# Patient Record
Sex: Female | Born: 2000 | Hispanic: No | Marital: Single | Smoking: Never smoker
Health system: Southern US, Community
[De-identification: ages and names within clinical notes are randomized; demographics above are authoritative.]

## PROBLEM LIST (undated history)

## (undated) DIAGNOSIS — T7840XA Allergy, unspecified, initial encounter: Secondary | ICD-10-CM

## (undated) DIAGNOSIS — J45909 Unspecified asthma, uncomplicated: Secondary | ICD-10-CM

## (undated) HISTORY — DX: Unspecified asthma, uncomplicated: J45.909

---
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
## 2019-01-15 DIAGNOSIS — F4310 Post-traumatic stress disorder, unspecified: Secondary | ICD-10-CM | POA: Diagnosis not present

## 2019-01-21 DIAGNOSIS — F4310 Post-traumatic stress disorder, unspecified: Secondary | ICD-10-CM | POA: Diagnosis not present

## 2019-02-02 DIAGNOSIS — F4310 Post-traumatic stress disorder, unspecified: Secondary | ICD-10-CM | POA: Diagnosis not present

## 2019-03-03 DIAGNOSIS — F4310 Post-traumatic stress disorder, unspecified: Secondary | ICD-10-CM | POA: Diagnosis not present

## 2019-03-16 DIAGNOSIS — F4310 Post-traumatic stress disorder, unspecified: Secondary | ICD-10-CM | POA: Diagnosis not present

## 2019-04-16 DIAGNOSIS — F321 Major depressive disorder, single episode, moderate: Secondary | ICD-10-CM | POA: Diagnosis not present

## 2019-04-16 DIAGNOSIS — F4310 Post-traumatic stress disorder, unspecified: Secondary | ICD-10-CM | POA: Diagnosis not present

## 2019-05-18 IMAGING — US US OB LIMITED
1 series · 14 of 28 positions shown · non-contrast
Comparison: none

CLINICAL DATA: Pelvic cramping and abdominal pain x2 weeks.

EXAM:
LIMITED OBSTETRIC ULTRASOUND AND TRANSVAGINAL OBSTETRIC ULTRASOUND

[Series 1: us ob limited · 0.25mm/px · 14 of 37 slices shown]
[im 2/37]
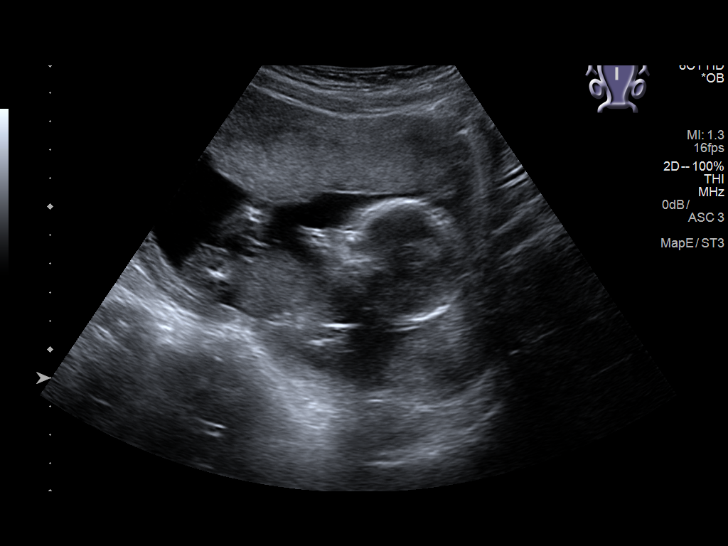
[im 5/37]
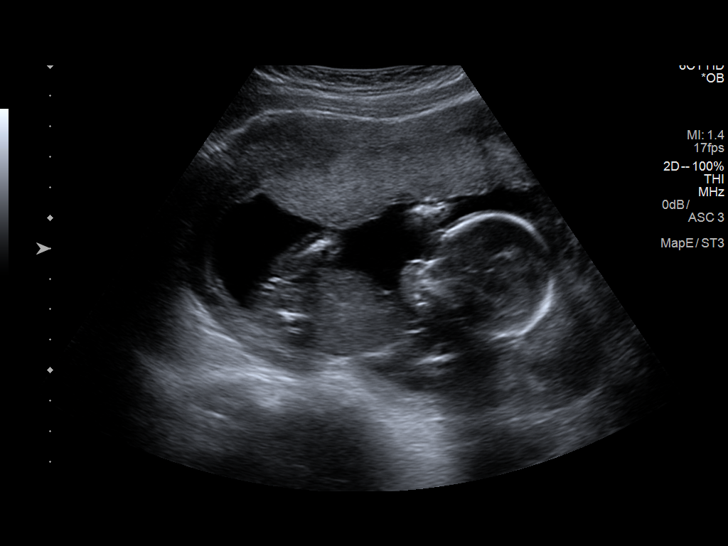
[im 7/37]
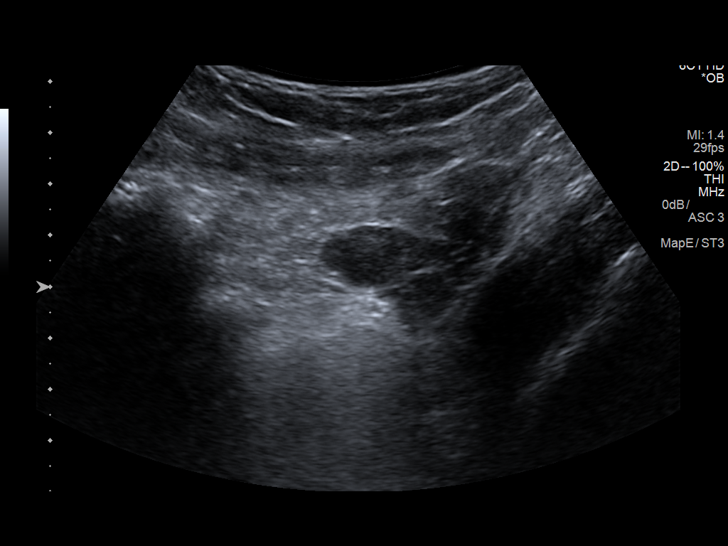
[im 10/37]
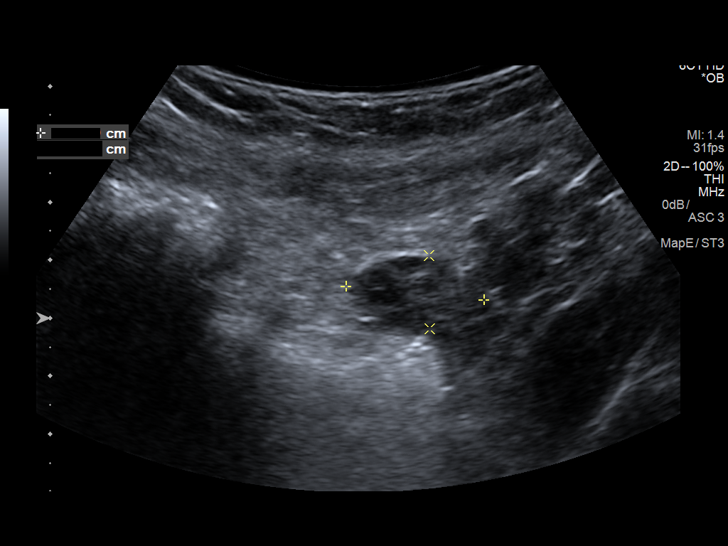
[im 13/37]
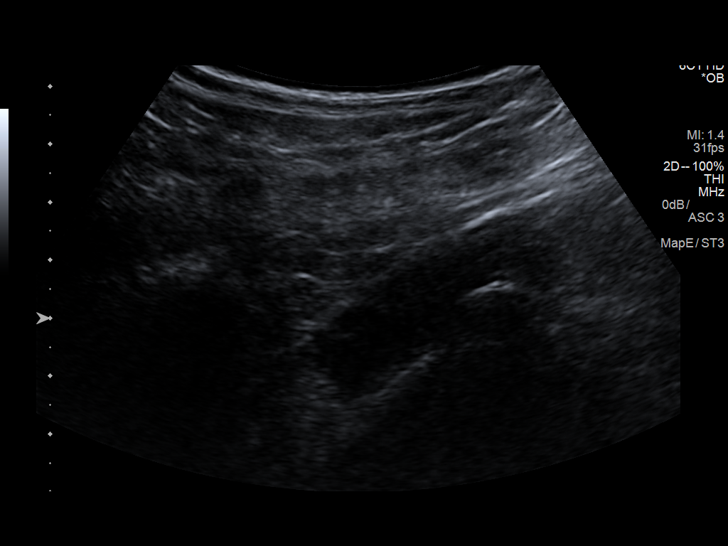
[im 15/37]
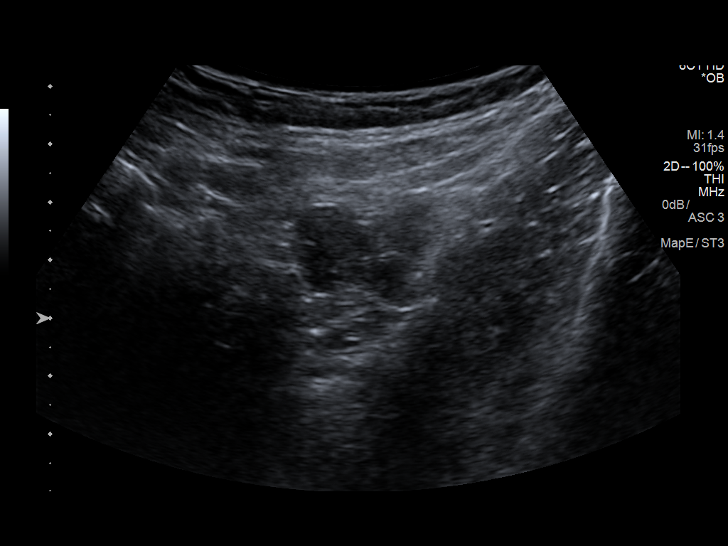
[im 18/37]
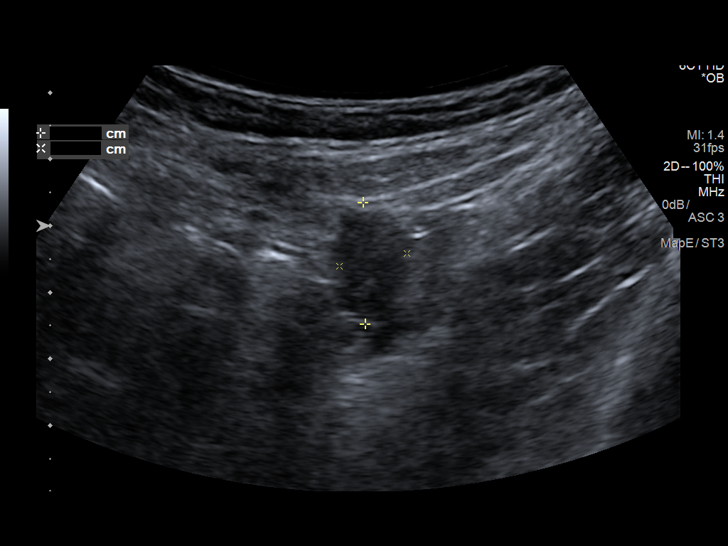
[im 21/37]
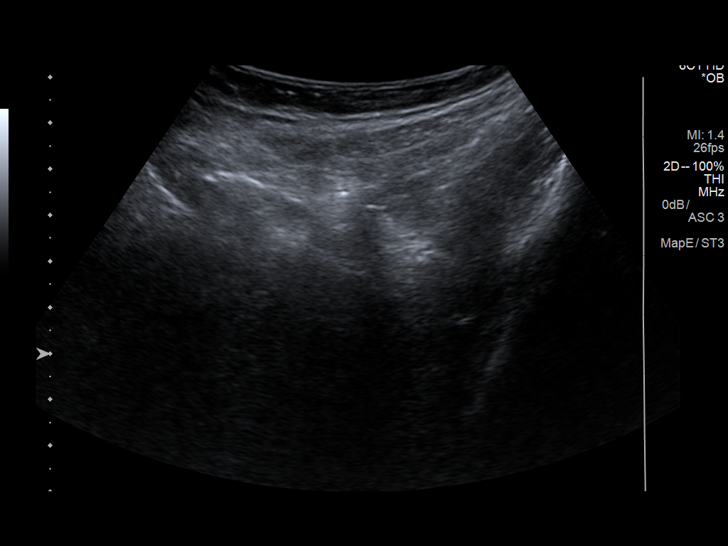
[im 23/37]
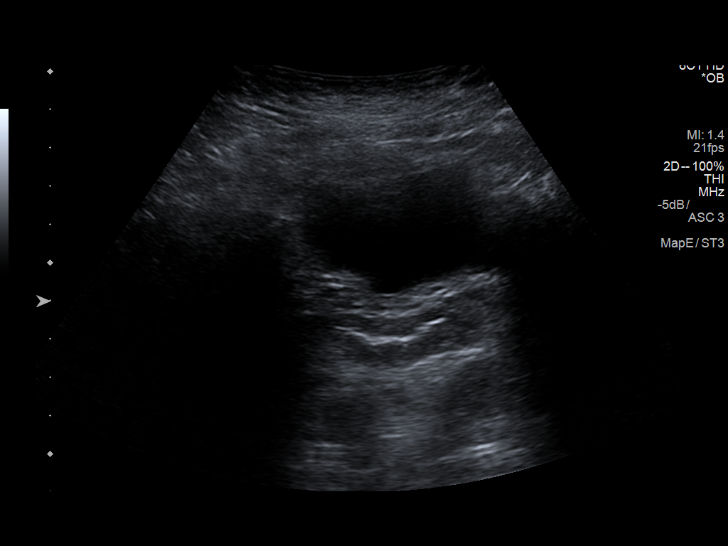
[im 26/37]
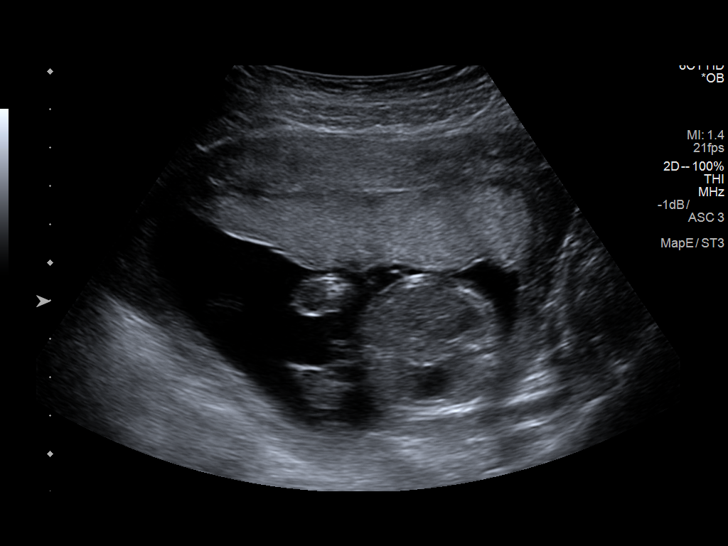
[im 29/37]
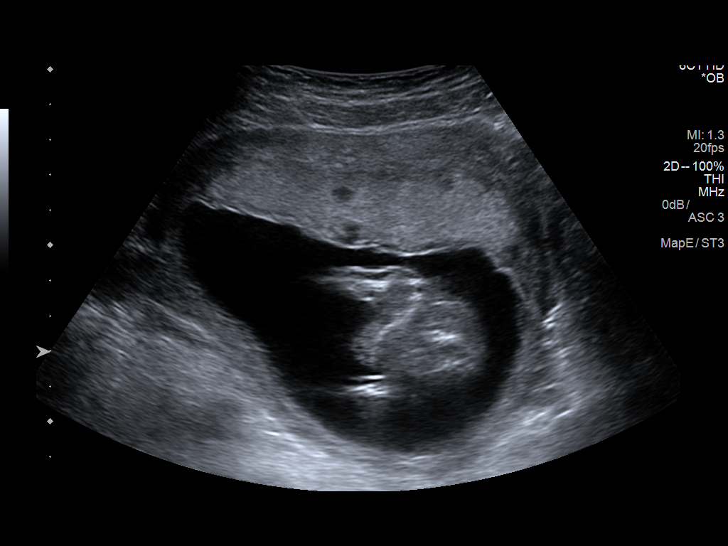
[im 31/37]
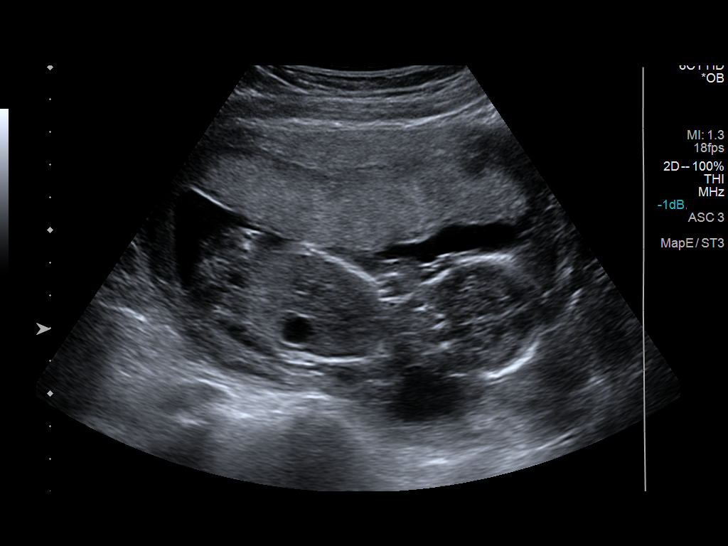
[im 34/37]
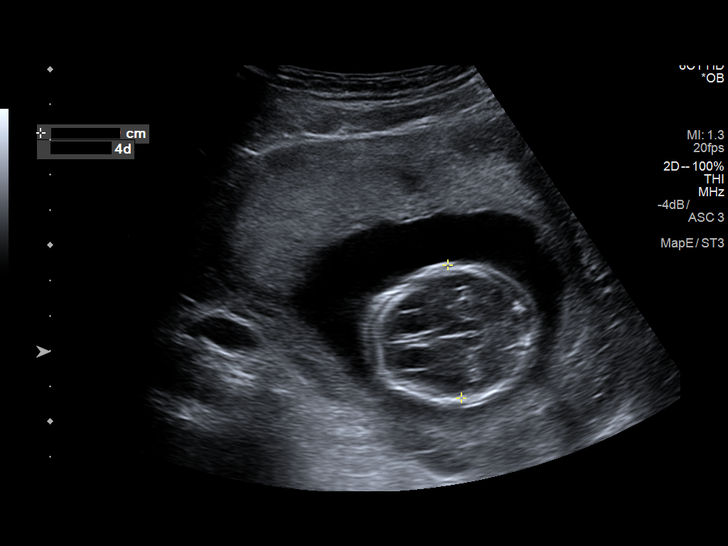
[im 37/37]
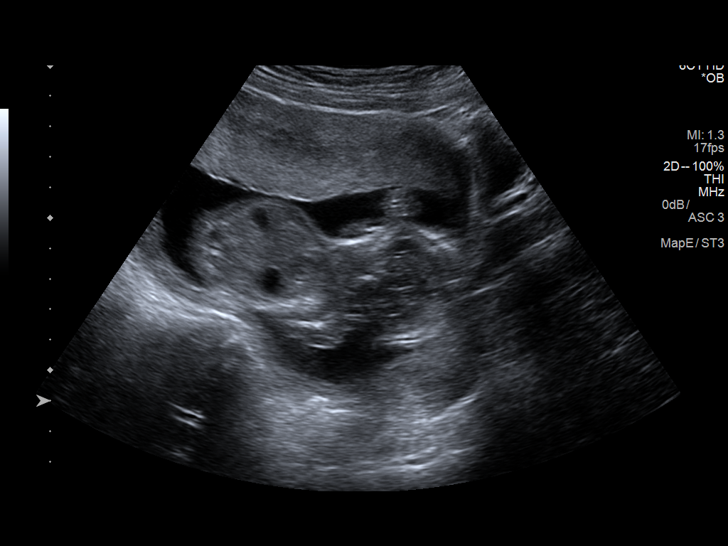

[14 of 28 positions shown; findings below may reference images not displayed]

FINDINGS: Number of Fetuses: 1

Heart Rate:  135 bpm

Movement: Yes

Presentation: Cephalic

Placental Location: Anterior

Previa: No

Amniotic Fluid (Subjective): Within normal limits.

BPD:  3.78cm 17w 4d

MATERNAL FINDINGS:

Cervix:  Appears closed.

Uterus/Adnexae:  Normal appearing ovaries.
IMPRESSION: Viable 17 week 4 day intrauterine gestation. Anterior placenta
without abruption. No previa.

This exam is performed on an emergent basis and does not
comprehensively evaluate fetal size, dating, or anatomy; follow-up
complete OB US should be considered if further fetal assessment is
warranted.

## 2019-06-11 DIAGNOSIS — F321 Major depressive disorder, single episode, moderate: Secondary | ICD-10-CM | POA: Diagnosis not present

## 2019-06-11 DIAGNOSIS — F4310 Post-traumatic stress disorder, unspecified: Secondary | ICD-10-CM | POA: Diagnosis not present

## 2019-09-26 ENCOUNTER — Ambulatory Visit: Payer: Medicaid Other | Attending: Internal Medicine

## 2019-09-26 DIAGNOSIS — Z23 Encounter for immunization: Secondary | ICD-10-CM

## 2019-09-26 NOTE — Progress Notes (Signed)
   Covid-19 Vaccination Clinic  Name:  Nerissa Constantin    MRN: [id number] DOB: Sep 14, 2000  09/26/2019  Ms. Cabrera-Saucedo was observed post Covid-19 immunization for 15 minutes without incident. She was provided with Vaccine Information Sheet and instruction to access the V-Safe system.   Ms. Croswell was instructed to call 911 with any severe reactions post vaccine: Marland Kitchen Difficulty breathing  . Swelling of face and throat  . A fast heartbeat  . A bad rash all over body  . Dizziness and weakness   Immunizations Administered    Name Date Dose VIS Date Route   Pfizer COVID-19 Vaccine 09/26/2019 10:12 AM 0.3 mL 06/24/2018 Intramuscular   Manufacturer: [employer], [employer]   Lot: [id number]   NDC: [address]-9

## 2019-10-19 ENCOUNTER — Ambulatory Visit: Payer: Medicaid Other | Attending: Internal Medicine

## 2019-10-19 DIAGNOSIS — Z23 Encounter for immunization: Secondary | ICD-10-CM

## 2019-10-19 NOTE — Progress Notes (Signed)
   Covid-19 Vaccination Clinic  Name:  Jill Woods    MRN: [id number] DOB: 2000/06/06  10/19/2019  Jill Woods was observed post Covid-19 immunization for 15 minutes without incident. She was provided with Vaccine Information Sheet and instruction to access the V-Safe system.   Jill Woods was instructed to call 911 with any severe reactions post vaccine: Marland Kitchen Difficulty breathing  . Swelling of face and throat  . A fast heartbeat  . A bad rash all over body  . Dizziness and weakness   Immunizations Administered    Name Date Dose VIS Date Route   Pfizer COVID-19 Vaccine 10/19/2019  3:45 PM 0.3 mL 06/24/2018 Intramuscular   Manufacturer: [employer], [employer]   Lot: [id number]   NDC: [address]-3      Covid-19 Vaccination Clinic  Name:  Jill Woods    MRN: [id number] DOB: 2000/09/21  10/19/2019  Jill Woods was observed post Covid-19 immunization for 15 minutes without incident. She was provided with Vaccine Information Sheet and instruction to access the V-Safe system.   Jill Woods was instructed to call 911 with any severe reactions post vaccine: Marland Kitchen Difficulty breathing  . Swelling of face and throat  . A fast heartbeat  . A bad rash all over body  . Dizziness and weakness   Immunizations Administered    Name Date Dose VIS Date Route   Pfizer COVID-19 Vaccine 10/19/2019  3:45 PM 0.3 mL 06/24/2018 Intramuscular   Manufacturer: [employer], [employer]   Lot: [id number]   NDC: [address]-7      Covid-19 Vaccination Clinic  Name:  Jill Woods    MRN: [id number] DOB: 2000-11-22  10/19/2019  Jill Woods was observed post Covid-19 immunization for 15 minutes without incident. She was provided with Vaccine Information Sheet and instruction to access the V-Safe system.   Jill Woods was instructed to call 911 with any severe reactions post vaccine: Marland Kitchen Difficulty breathing  . Swelling of face and throat  . A fast heartbeat    . A bad rash all over body  . Dizziness and weakness   Immunizations Administered    Name Date Dose VIS Date Route   Pfizer COVID-19 Vaccine 10/19/2019  3:45 PM 0.3 mL 06/24/2018 Intramuscular   Manufacturer: [employer], [employer]   Lot: [id number]   NDC: [address]-9
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
## 2019-11-26 ENCOUNTER — Emergency Department (HOSPITAL_COMMUNITY)
Admission: EM | Admit: 2019-11-26 | Discharge: 2019-11-26 | Disposition: A | Discharge status: Home / Self Care | Payer: BC Managed Care – PPO | Attending: Emergency Medicine | Admitting: Emergency Medicine

## 2019-11-26 ENCOUNTER — Other Ambulatory Visit: Payer: Self-pay

## 2019-11-26 DIAGNOSIS — R109 Unspecified abdominal pain: Secondary | ICD-10-CM | POA: Diagnosis present

## 2019-11-26 DIAGNOSIS — Z79899 Other long term (current) drug therapy: Secondary | ICD-10-CM | POA: Diagnosis not present

## 2019-11-26 DIAGNOSIS — N390 Urinary tract infection, site not specified: Secondary | ICD-10-CM | POA: Diagnosis not present

## 2019-11-26 DIAGNOSIS — R1084 Generalized abdominal pain: Secondary | ICD-10-CM | POA: Insufficient documentation

## 2019-11-26 LAB — COMPREHENSIVE METABOLIC PANEL
ALT: 12 U/L (ref 0–44)
AST: 16 U/L (ref 15–41)
Albumin: 4.2 g/dL (ref 3.5–5.0)
Alkaline Phosphatase: 82 U/L (ref 38–126)
Anion gap: 11 (ref 5–15)
BUN: 14 mg/dL (ref 6–20)
CO2: 24 mmol/L (ref 22–32)
Calcium: 9 mg/dL (ref 8.9–10.3)
Chloride: 102 mmol/L (ref 98–111)
Creatinine, Ser: 0.49 mg/dL (ref 0.44–1.00)
GFR calc Af Amer: >60 mL/min (ref >60)
GFR calc non Af Amer: >60 mL/min (ref >60)
Glucose, Bld: 91 mg/dL (ref 70–99)
Potassium: 3.5 mmol/L (ref 3.5–5.1)
Sodium: 137 mmol/L (ref 135–145)
Total Bilirubin: 0.7 mg/dL (ref 0.3–1.2)
Total Protein: 7.1 g/dL (ref 6.5–8.1)

## 2019-11-26 LAB — CBC
HCT: 37.8 % (ref 36.0–46.0)
Hemoglobin: 12.6 g/dL (ref 12.0–15.0)
MCH: 30.9 pg (ref 26.0–34.0)
MCHC: 33.3 g/dL (ref 30.0–36.0)
MCV: 92.6 fL (ref 80.0–100.0)
Platelets: 213 10*3/uL (ref 150–400)
RBC: 4.08 MIL/uL (ref 3.87–5.11)
RDW: 11.7 % (ref 11.5–15.5)
WBC: 11.4 10*3/uL — ABNORMAL HIGH (ref 4.0–10.5)
nRBC: 0 % (ref 0.0–0.2)

## 2019-11-26 LAB — URINALYSIS, ROUTINE W REFLEX MICROSCOPIC
Bilirubin Urine: NEGATIVE
Glucose, UA: NEGATIVE mg/dL
Hgb urine dipstick: NEGATIVE
Ketones, ur: 20 mg/dL — AB
Nitrite: NEGATIVE
Protein, ur: NEGATIVE mg/dL
Specific Gravity, Urine: 1.027 (ref 1.005–1.030)
pH: 5 (ref 5.0–8.0)

## 2019-11-26 LAB — I-STAT BETA HCG BLOOD, ED (MC, WL, AP ONLY): I-stat hCG, quantitative: <5 m[IU]/mL (ref <5)

## 2019-11-26 LAB — LIPASE, BLOOD: Lipase: 21 U/L (ref 11–51)

## 2019-11-26 MED ORDER — CEPHALEXIN 500 MG PO CAPS: 500.0000 mg | ORAL_CAPSULE | Freq: Three times a day (TID) | ORAL | 0 refills | Status: AC

## 2019-11-26 NOTE — ED Provider Notes (Signed)
[facility] EMERGENCY DEPARTMENT Provider Note   CSN: [id number] Arrival date & time: 11/26/19  1146     History Chief Complaint  Patient presents with  . Abdominal Pain    Jill Woods is a 19 y.o. female.  The history is provided by the patient. No language interpreter was used.  Abdominal Pain Pain location:  Generalized Pain quality: aching   Pain radiates to:  Does not radiate Pain severity:  Moderate Onset quality:  Gradual Timing:  Constant Progression:  Worsening Chronicity:  New Context: not sick contacts   Relieved by:  Nothing Worsened by:  Nothing Ineffective treatments:  None tried Associated symptoms: no chest pain, no cough and no diarrhea   Risk factors: not pregnant        No past medical history on file.  There are no problems to display for this patient.   Past Surgical History:  Procedure Laterality Date  . CESAREAN SECTION N/A 07/24/2018   Procedure: CESAREAN SECTION;  Surgeon: Lazaro Arms, MD;  Location: [facility] LD [facility];  Service: Obstetrics;  Laterality: N/A;     OB History    Gravida  1   Para      Term      Preterm      AB      Living        SAB      TAB      Ectopic      Multiple      Live Births              No family history on file.  Social History   Tobacco Use  . Smoking status: Never Smoker  . Smokeless tobacco: Never Used  Vaping Use  . Vaping Use: Never used  Substance Use Topics  . Alcohol use: Never  . Drug use: Never    Home Medications Prior to Admission medications   Medication Sig Start Date End Date Taking? Authorizing Provider  cephALEXin (KEFLEX) 500 MG capsule Take 1 capsule (500 mg total) by mouth 3 (three) times daily for 10 days. 11/26/19 12/06/19  Elson Areas, PA-C  ferrous sulfate 325 (65 FE) MG EC tablet Take 1 tablet (325 mg total) by mouth daily. 07/26/18 07/26/19  Gwenevere Abbot, MD  ibuprofen (ADVIL,MOTRIN) 800 MG tablet Take 1 tablet (800 mg  total) by mouth every 6 (six) hours. 07/26/18   Gwenevere Abbot, MD  Prenatal Vit-Fe Fumarate-FA (PRENATAL VITAMINS) 28-0.8 MG TABS Take 1 tablet by mouth daily. 09/29/18   Hermina Staggers, MD    Allergies    Pollen extract  Review of Systems   Review of Systems  Respiratory: Negative for cough.   Cardiovascular: Negative for chest pain.  Gastrointestinal: Positive for abdominal pain. Negative for diarrhea.  All other systems reviewed and are negative.   Physical Exam Updated Vital Signs BP (!) 113/59 (BP Location: Right Arm)   Pulse 81   Temp 98.6 F (37 C) (Oral)   Resp 16   SpO2 100%   Physical Exam Vitals and nursing note reviewed.  Constitutional:      Appearance: She is well-developed.  HENT:     Head: Normocephalic.  Cardiovascular:     Rate and Rhythm: Normal rate.  Pulmonary:     Effort: Pulmonary effort is normal.  Abdominal:     General: Bowel sounds are normal. There is no distension.     Palpations: Abdomen is soft.     Tenderness: There is  no abdominal tenderness.     Hernia: No hernia is present.  Musculoskeletal:        General: Normal range of motion.     Cervical back: Normal range of motion.  Skin:    General: Skin is warm.  Neurological:     General: No focal deficit present.     Mental Status: She is alert and oriented to person, place, and time.     ED Results / Procedures / Treatments   Labs (all labs ordered are listed, but only abnormal results are displayed) Labs Reviewed  CBC - Abnormal; Notable for the following components:      Result Value   WBC 11.4 (*)    All other components within normal limits  URINALYSIS, ROUTINE W REFLEX MICROSCOPIC - Abnormal; Notable for the following components:   APPearance HAZY (*)    Ketones, ur 20 (*)    Leukocytes,Ua MODERATE (*)    Bacteria, UA RARE (*)    All other components within normal limits  LIPASE, BLOOD  COMPREHENSIVE METABOLIC PANEL  I-STAT BETA HCG BLOOD, ED ([facility], [facility], [facility] ONLY)     EKG None  Radiology No results found.  Procedures Procedures (including critical care time)  Medications Ordered in ED Medications - No data to display  ED Course  I have reviewed the triage vital signs and the nursing notes.  Pertinent labs & imaging results that were available during my care of the patient were reviewed by me and considered in my medical decision making (see chart for details).    MDM Rules/Calculators/A&P                          MDM:  Pt has a nrmal abdominal exam.  Urine shows greater than 20 wbc's  Pt given rx for keflex and abdominal pain precautions  Final Clinical Impression(s) / ED Diagnoses Final diagnoses:  Urinary tract infection without hematuria, site unspecified    Rx / DC Orders ED Discharge Orders         Ordered    cephALEXin (KEFLEX) 500 MG capsule  3 times daily     Discontinue  Reprint     11/26/19 1706        An After Visit Summary was printed and given to the patient.    Elson Areas, Cordelia Poche 11/26/19 1710    Charlynne Pander, MD 11/26/19 [phone]

## 2019-11-26 NOTE — Discharge Instructions (Signed)
Return if any problems.

## 2019-11-26 NOTE — ED Notes (Signed)
Verbalized understanding of DC instructions, Rx, follow up care 

## 2019-11-26 NOTE — ED Triage Notes (Signed)
Pt reports generalized abdominal pain and nausea without vomiting since 0800 today. 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 Did not fall or pass out.

## 2019-11-27 ENCOUNTER — Telehealth: Payer: Self-pay | Admitting: *Deleted

## 2019-11-27 NOTE — Telephone Encounter (Signed)
Contacted pt to complete transition of care assessment:  Transition Care Management Follow-up Telephone Call  . Medicaid Managed Care Transition Call Status:MM [facility] Call Made  . Date of discharge and from where: [facility], November 26, 2019  . How have you been since you were released from the hospital? fine . Any questions or concerns? no  Items Reviewed: Marland Kitchen Did the pt receive and understand the discharge instructions provided? Yes  . Medications obtained and verified? Yes  . Any new allergies since your discharge? No  . Dietary orders reviewed? No . Do you have support at home? Yes, family Functional Questionnaire: (I = Independent and D = Dependent)  ADLs: Independent Bathing/Dressing:Independent Meal Prep: Independent Eating: Independent Maintaining continence: Independent Transferring/Ambulation: Independent Managing Meds: Independent   Follow up appointments reviewed:  PCP Hospital f/u appt confirmed? No  pt does not have a PCP Specialist Hospital f/u appt confirmed? N/a  Are transportation arrangements needed? No   If their condition worsens, is the pt aware to call PCP or go to the EmergencyDept.? yes Was the patient provided with contact information for the PCP's office or ED? yes  Was to pt encouraged to call back with questions or concerns? Yes  The pt states she would like establish care with a PCP; explained [facility] agent will call her regarding this matter; she verbalized understanding.  Burnard Bunting, RN, BSN, CCRN [facility] [phone]

## 2020-05-06 IMAGING — US US MFM OB COMP +14 WKS
1 series · 14 of 28 positions shown · non-contrast
Comparison: none

[Series 1: us mfm ob comp +14 wks · 94 acquisitions, 14 frames shown]
[im 4/94]
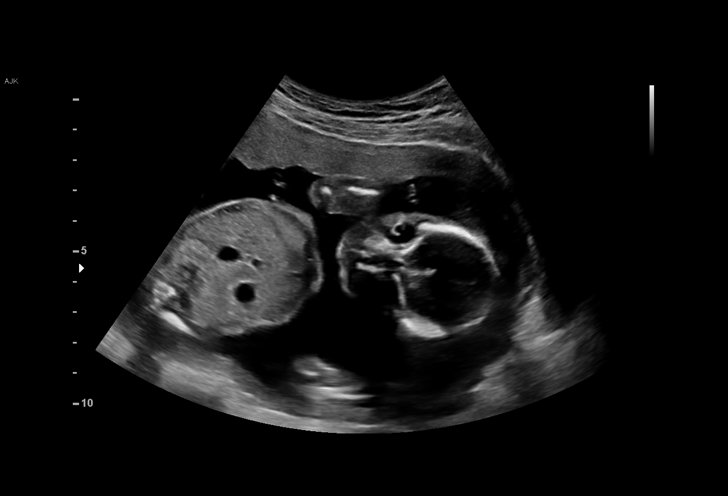
[im 11/94]
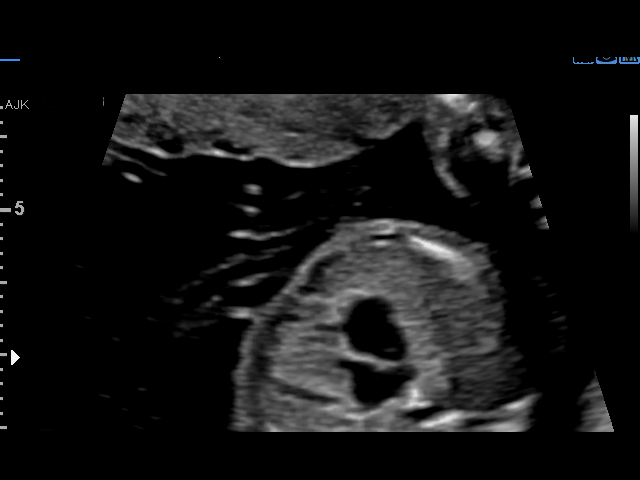
[im 18/94]
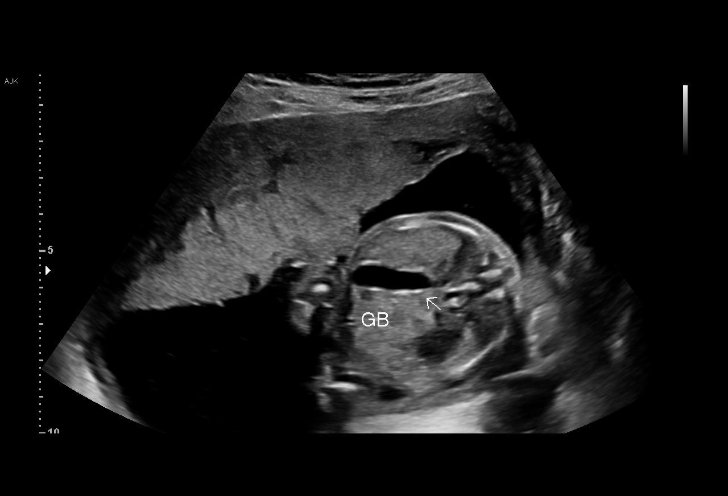
[im 25/94]
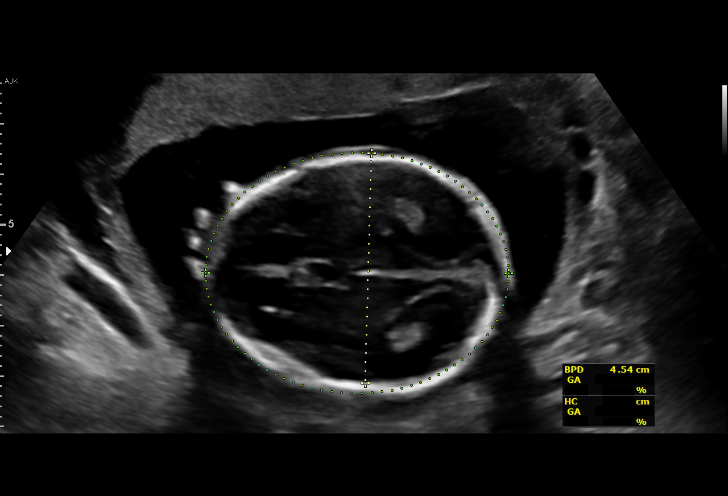
[im 32/94]
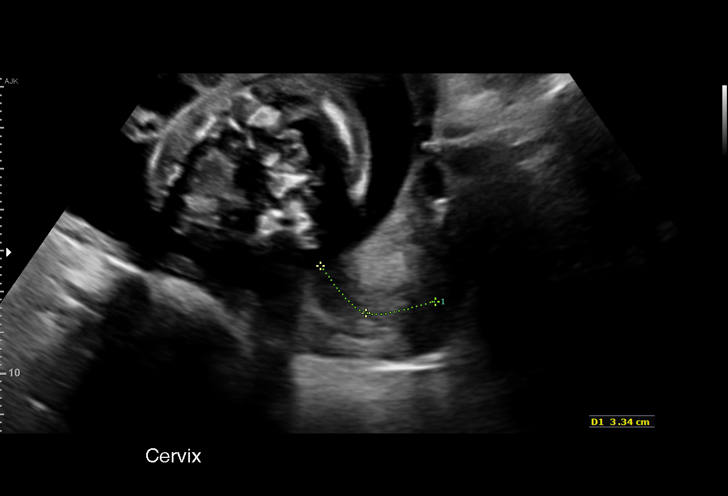
[im 38/94]
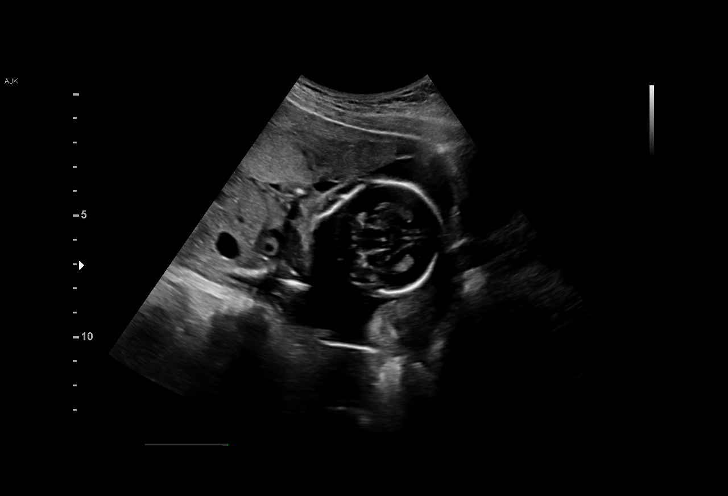
[im 45/94]
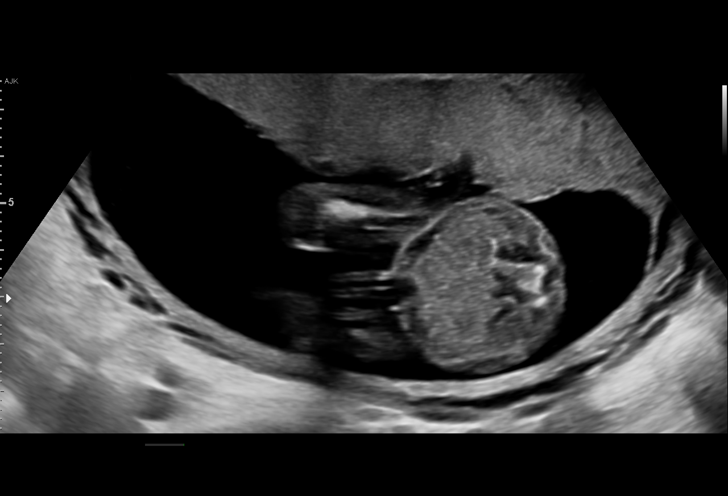
[im 52/94]
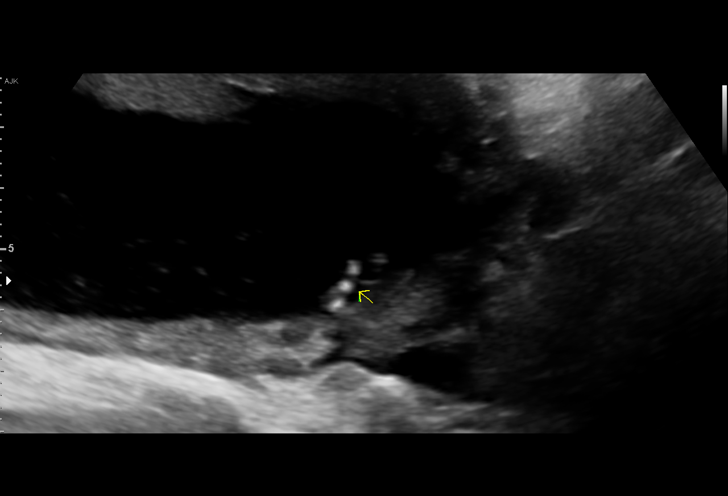
[im 59/94]
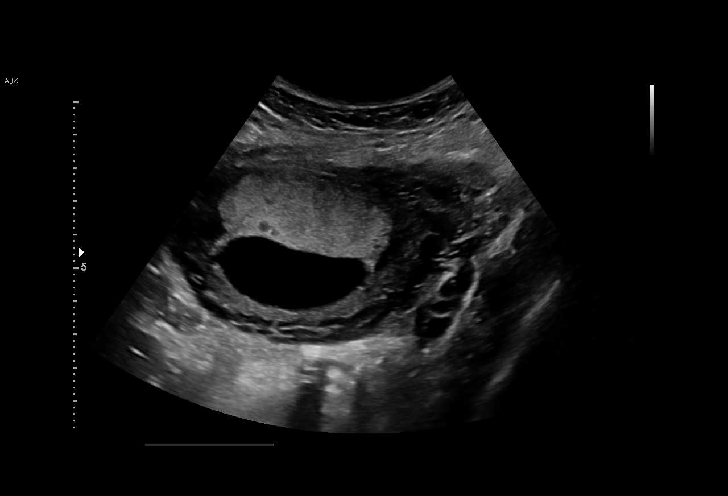
[im 66/94]
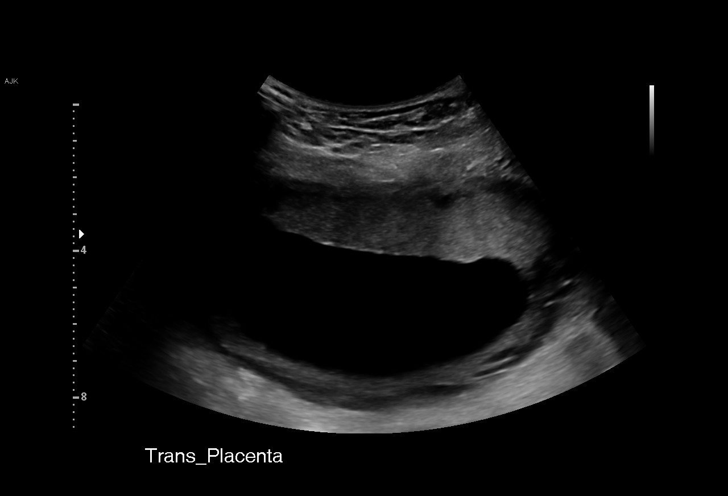
[im 73/94]
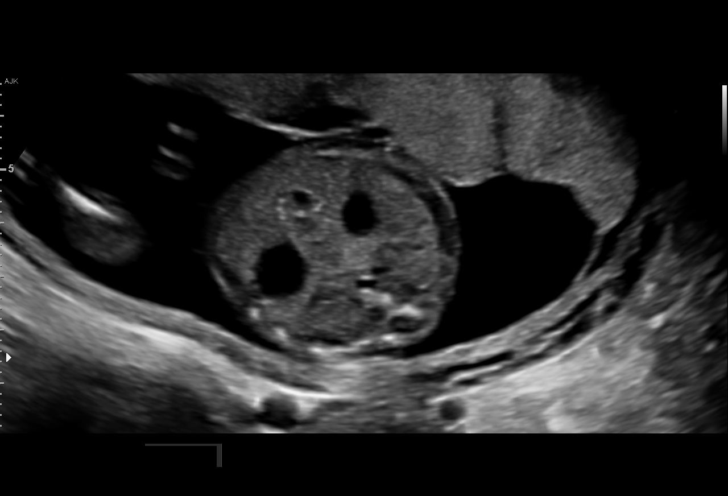
[im 80/94]
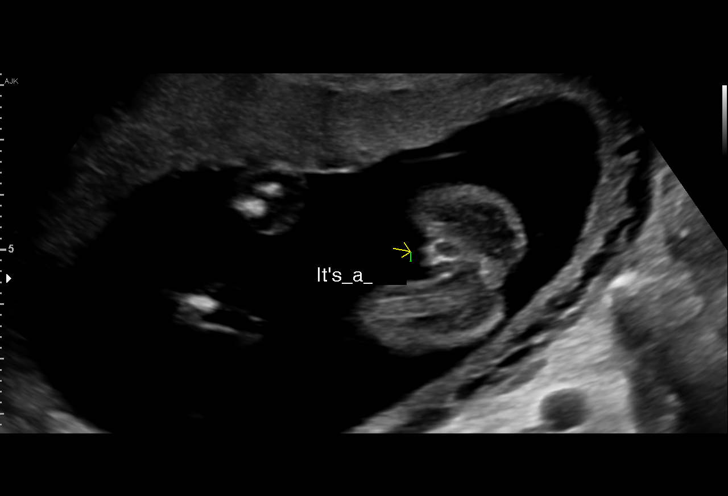
[im 87/94]
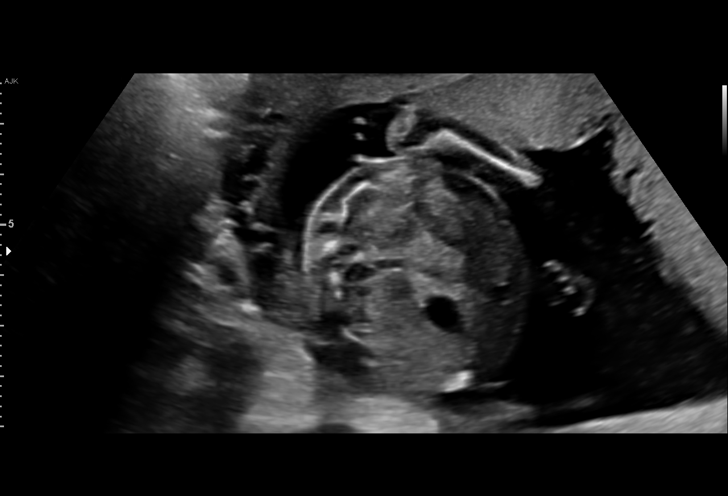
[im 94/94]
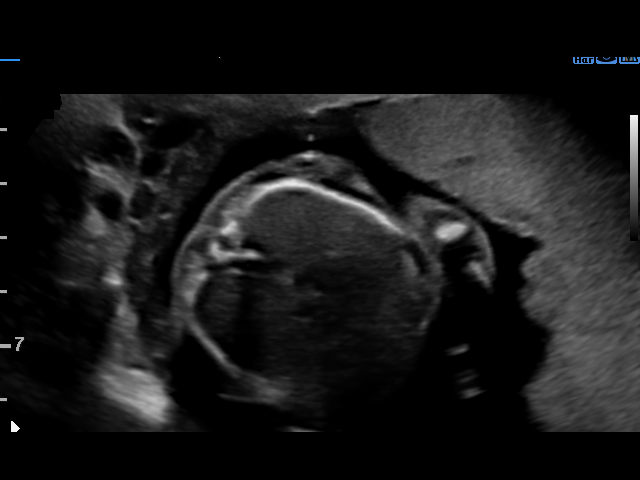

[14 of 28 positions shown; findings below may reference images not displayed]

[REDACTED]

  1  US MFM OB COMP + 14 WK               76805.01     MEI LAN
 ----------------------------------------------------------------------

 ----------------------------------------------------------------------
Indications

  Encounter for antenatal screening for
  malformations
  20 weeks gestation of pregnancy
 ----------------------------------------------------------------------
Vital Signs

 BMI:
Fetal Evaluation

 Num Of Fetuses:          1
 Fetal Heart Rate(bpm):   158
 Cardiac Activity:        Observed
 Presentation:            Cephalic
 Placenta:                Anterior
 P. Cord Insertion:       Visualized, central

 Amniotic Fluid
 AFI FV:      Within normal limits

                             Largest Pocket(cm)

Biometry

 BPD:      45.4  mm     G. Age:  19w 5d         32  %    CI:        73.09   %    70 - 86
                                                         FL/HC:       16.4  %    16.8 -
 HC:      168.8  mm     G. Age:  19w 4d         17  %    HC/AC:       1.09       1.09 -
 AC:      155.5  mm     G. Age:  20w 5d         63  %    FL/BPD:      61.0  %
 FL:       27.7  mm     G. Age:  18w 3d          4  %    FL/AC:       17.8  %    20 - 24
 HUM:      26.9  mm     G. Age:  18w 4d         10  %
 CER:      19.7  mm     G. Age:  18w 6d         18  %
 NFT:       5.6  mm

 LV:        5.4  mm
 CM:        5.4  mm

 Est. FW:     306   gm   0 lb 11 oz      39  %
OB History

 Gravidity:    1
Gestational Age

 LMP:           18w 4d        Date:  11/02/17                 EDD:   08/09/18
 U/S Today:     19w 4d                                        EDD:   08/02/18
 Best:          20w 1d     Det. By:  Previous Ultrasound      EDD:   07/29/18
                                     (02/22/18)
Anatomy

 Cranium:               Appears normal         Aortic Arch:            Appears normal
 Cavum:                 Appears normal         Ductal Arch:            Appears normal
 Ventricles:            Appears normal         Diaphragm:              Appears normal
 Choroid Plexus:        Appears normal         Stomach:                Appears normal, left
                                                                       sided
 Cerebellum:            Appears normal         Abdomen:                Appears normal
 Posterior Fossa:       Appears normal         Abdominal Wall:         Appears nml (cord
                                                                       insert, abd wall)
 Nuchal Fold:           Appears normal         Cord Vessels:           Appears normal (3
                                                                       vessel cord)
 Face:                  Appears normal         Kidneys:                Appear normal
                        (orbits and profile)
 Lips:                  Appears normal         Bladder:                Appears normal
 Thoracic:              Appears normal         Spine:                  Appears normal
 Heart:                 Appears normal         Upper Extremities:      Appears normal
                        (4CH, axis, and
                        situs)
 RVOT:                  Appears normal         Lower Extremities:      Appears normal
 LVOT:                  Appears normal

 Other:  Fetus appears to be female. Heels and LT 5th digit visualized.
         Technically difficult due to fetal position.
Cervix Uterus Adnexa

 Cervix
 Length:           3.34  cm.
 Normal appearance by transabdominal scan.

 Uterus
 No abnormality visualized.

 Left Ovary
 Not visualized.

 Right Ovary
 Not visualized.
 Adnexa
 No abnormality visualized.
Impression

 Normal interval growth.  No ultrasonic evidence of structural
 fetal anomalies.
 Genetic screening pending
Recommendations

 Follow up as clinically indicated.

## 2020-07-16 ENCOUNTER — Emergency Department (HOSPITAL_COMMUNITY)
Admission: EM | Admit: 2020-07-16 | Discharge: 2020-07-16 | Disposition: A | Discharge status: Home / Self Care | Payer: Medicaid Other | Attending: Emergency Medicine | Admitting: Emergency Medicine

## 2020-07-16 ENCOUNTER — Other Ambulatory Visit: Payer: Self-pay

## 2020-07-16 ENCOUNTER — Encounter (HOSPITAL_COMMUNITY): Payer: Self-pay | Admitting: Emergency Medicine

## 2020-07-16 DIAGNOSIS — R0981 Nasal congestion: Secondary | ICD-10-CM | POA: Diagnosis present

## 2020-07-16 DIAGNOSIS — Z7952 Long term (current) use of systemic steroids: Secondary | ICD-10-CM | POA: Insufficient documentation

## 2020-07-16 DIAGNOSIS — J301 Allergic rhinitis due to pollen: Secondary | ICD-10-CM | POA: Diagnosis not present

## 2020-07-16 DIAGNOSIS — J302 Other seasonal allergic rhinitis: Secondary | ICD-10-CM

## 2020-07-16 HISTORY — DX: Allergy, unspecified, initial encounter: T78.40XA

## 2020-07-16 MED ORDER — LORATADINE 10 MG PO TABS: 10.0000 mg | ORAL_TABLET | Freq: Every day | ORAL | 0 refills | Status: DC

## 2020-07-16 MED ORDER — FLUTICASONE PROPIONATE 50 MCG/ACT NA SUSP: 1.0000 | Freq: Every day | NASAL | 2 refills | Status: DC

## 2020-07-16 NOTE — ED Provider Notes (Signed)
[facility] EMERGENCY DEPARTMENT Provider Note   CSN: [id number] Arrival date & time: 07/16/20  1648     History Chief Complaint  Patient presents with  . Allergies    Jill Woods is a 20 y.o. female.  Patient is a 20 year old female with a history of seasonal allergies who presents with runny nose, watery eyes, and cough.  Patient states that her symptoms have been going on for about a month.  Patient denies any shortness of breath.  She denies any chest pain.  Patient states that she has had the symptoms whenever the weather gets warm for years now.  She denies any fevers or chills.  She denies any productive cough.  States that her cough is mostly dry.  She has not been fatigued or weak.  She has been able to continue to exert herself without any difficulties.  She denies any leg swelling or leg pain.  Denies any abdominal pain, nausea, vomiting, diarrhea.  Patient has been taking Benadryl for her symptoms without any relief.  Has not had any rashes or skin changes.        Past Medical History:  Diagnosis Date  . Allergies     There are no problems to display for this patient.   Past Surgical History:  Procedure Laterality Date  . CESAREAN SECTION N/A 07/24/2018   Procedure: CESAREAN SECTION;  Surgeon: Lazaro Arms, MD;  Location: [facility] LD [facility]S;  Service: Obstetrics;  Laterality: N/A;     OB History    Gravida  1   Para      Term      Preterm      AB      Living        SAB      IAB      Ectopic      Multiple      Live Births              No family history on file.  Social History   Tobacco Use  . Smoking status: Never Smoker  . Smokeless tobacco: Never Used  Vaping Use  . Vaping Use: Never used  Substance Use Topics  . Alcohol use: Never  . Drug use: Never    Home Medications Prior to Admission medications   Medication Sig Start Date End Date Taking? Authorizing Provider  fluticasone (FLONASE) 50 MCG/ACT  nasal spray Place 1 spray into both nostrils daily. 07/16/20  Yes Kugler, [place], MD  loratadine (CLARITIN) 10 MG tablet Take 1 tablet (10 mg total) by mouth daily. 07/16/20  Yes Kugler, [place], MD  ferrous sulfate 325 (65 FE) MG EC tablet Take 1 tablet (325 mg total) by mouth daily. 07/26/18 07/26/19  Gwenevere Abbot, MD  ibuprofen (ADVIL,MOTRIN) 800 MG tablet Take 1 tablet (800 mg total) by mouth every 6 (six) hours. 07/26/18   Gwenevere Abbot, MD  Prenatal Vit-Fe Fumarate-FA (PRENATAL VITAMINS) 28-0.8 MG TABS Take 1 tablet by mouth daily. 09/29/18   Hermina Staggers, MD    Allergies    Pollen extract  Review of Systems   Review of Systems  Constitutional: Negative for chills and fever.  HENT: Positive for rhinorrhea. Negative for ear pain and sore throat.   Eyes: Positive for itching. Negative for pain.  Respiratory: Positive for cough. Negative for shortness of breath.   Cardiovascular: Negative for chest pain and palpitations.  Gastrointestinal: Negative for abdominal pain and vomiting.  Genitourinary: Negative for dysuria and hematuria.  Musculoskeletal:  Negative for arthralgias and back pain.  Skin: Negative for color change and rash.  Neurological: Negative for seizures and syncope.  All other systems reviewed and are negative.   Physical Exam Updated Vital Signs BP (!) 105/55 (BP Location: Left Arm)   Pulse 69   Temp 98.8 F (37.1 C) (Oral)   Resp 15   LMP 07/09/2020   SpO2 98%   Physical Exam Vitals and nursing note reviewed.  Constitutional:      General: She is not in acute distress.    Appearance: She is well-developed.  HENT:     Head: Normocephalic and atraumatic.     Right Ear: External ear normal.     Left Ear: External ear normal.     Nose: Rhinorrhea present.     Mouth/Throat:     Mouth: Mucous membranes are moist.     Pharynx: Oropharynx is clear.  Eyes:     General:        Right eye: No discharge.        Left eye: No discharge.     Conjunctiva/sclera:  Conjunctivae normal.  Cardiovascular:     Rate and Rhythm: Normal rate and regular rhythm.  Pulmonary:     Effort: Pulmonary effort is normal. No respiratory distress.     Breath sounds: Normal breath sounds. No wheezing, rhonchi or rales.  Abdominal:     Palpations: Abdomen is soft.     Tenderness: There is no abdominal tenderness.  Musculoskeletal:     Cervical back: Neck supple.  Lymphadenopathy:     Cervical: No cervical adenopathy.  Skin:    General: Skin is warm and dry.     Findings: No rash.  Neurological:     General: No focal deficit present.     Mental Status: She is alert and oriented to person, place, and time.     ED Results / Procedures / Treatments   Labs (all labs ordered are listed, but only abnormal results are displayed) Labs Reviewed - No data to display  EKG None  Radiology No results found.  Procedures Procedures   Medications Ordered in ED Medications - No data to display  ED Course  I have reviewed the triage vital signs and the nursing notes.  Pertinent labs & imaging results that were available during my care of the patient were reviewed by me and considered in my medical decision making (see chart for details).    MDM Rules/Calculators/A&P                         20 year old female who presents with runny nose, eye watering, eye itching, and cough.  Symptoms have been present for about a month.  Patient has tried Benadryl without relief.  Physical exam without any notable rashes.  Her lungs are clear to auscultation without focal findings which would suggest pneumonia and no wheezing.  No lower extremity swelling or edema.  No fevers or chills to suggest infectious symptoms.  Presentation most consistent with allergic rhinitis. Will start patient on Claritin and Flonase.  We will have her follow-up with PCP.  Final Clinical Impression(s) / ED Diagnoses Final diagnoses:  Seasonal allergies  Seasonal allergic rhinitis due to pollen     Rx / DC Orders ED Discharge Orders         Ordered    fluticasone (FLONASE) 50 MCG/ACT nasal spray  Daily        07/16/20 2048    loratadine (  CLARITIN) 10 MG tablet  Daily        07/16/20 2048           Kugler, [place], MD 07/16/20 2253    Lorre Nick, MD 07/17/20 [phone]

## 2020-07-16 NOTE — ED Triage Notes (Signed)
C/o sneezing, runny nose, and itchy eyes for several months.  States she is taking Benadryl without relief.

## 2020-07-16 NOTE — ED Provider Notes (Signed)
I saw and evaluated the patient, reviewed the resident's note and I agree with the findings and plan.    20 year old female presents with allergy symptoms.  Will place on medications and discharge   Lorre Nick, MD 07/16/20 2101

## 2020-07-18 ENCOUNTER — Telehealth: Payer: Self-pay

## 2020-07-18 NOTE — Telephone Encounter (Signed)
Transition Care Management Unsuccessful Follow-up Telephone Call  Date of discharge and from where:  07/16/2020 from [facility]  Attempts:  1st Attempt  Reason for unsuccessful TCM follow-up call:  Left voice message

## 2020-07-19 NOTE — Telephone Encounter (Signed)
Transition Care Management Follow-up Telephone Call  Date of discharge and from where: 07/16/2020 - [facility]  How have you been since you were released from the hospital? "Feeling a little better"  Any questions or concerns? No  Items Reviewed:  Did the pt receive and understand the discharge instructions provided? Yes   Medications obtained and verified? Yes   Other? No   Any new allergies since your discharge? No   Dietary orders reviewed? No  Do you have support at home? Yes   Home Care and Equipment/Supplies: Were home health services ordered? not applicable If so, what is the name of the agency? N/A  Has the agency set up a time to come to the patient's home? not applicable Were any new equipment or medical supplies ordered?  No What is the name of the medical supply agency? N/A Were you able to get the supplies/equipment? not applicable Do you have any questions related to the use of the equipment or supplies? No  Functional Questionnaire: (I = Independent and D = Dependent) ADLs: I  Bathing/Dressing- I  Meal Prep- I  Eating- I  Maintaining continence- I  Transferring/Ambulation- I  Managing Meds- I  Follow up appointments reviewed:   PCP Hospital f/u appt confirmed? No    Specialist Hospital f/u appt confirmed? No    Are transportation arrangements needed? No   If their condition worsens, is the pt aware to call PCP or go to the Emergency Dept.? Yes  Was the patient provided with contact information for the PCP's office or ED? Yes  Was to pt encouraged to call back with questions or concerns? Yes

## 2020-08-30 ENCOUNTER — Encounter (HOSPITAL_COMMUNITY): Payer: Self-pay

## 2020-08-30 ENCOUNTER — Other Ambulatory Visit: Payer: Self-pay

## 2020-08-30 ENCOUNTER — Emergency Department (HOSPITAL_COMMUNITY)
Admission: EM | Admit: 2020-08-30 | Discharge: 2020-08-30 | Disposition: A | Discharge status: Home / Self Care | Payer: Medicaid Other | Attending: Emergency Medicine | Admitting: Emergency Medicine

## 2020-08-30 DIAGNOSIS — J301 Allergic rhinitis due to pollen: Secondary | ICD-10-CM | POA: Diagnosis not present

## 2020-08-30 DIAGNOSIS — J302 Other seasonal allergic rhinitis: Secondary | ICD-10-CM | POA: Diagnosis not present

## 2020-08-30 DIAGNOSIS — J3489 Other specified disorders of nose and nasal sinuses: Secondary | ICD-10-CM | POA: Diagnosis present

## 2020-08-30 MED ORDER — LORATADINE 10 MG PO TABS: 10.0000 mg | ORAL_TABLET | Freq: Every day | ORAL | 2 refills | Status: DC

## 2020-08-30 NOTE — ED Triage Notes (Signed)
Pt reports she has allergies. Pt reports she was seen here for same and sent w/ RX w/ no relief.

## 2020-08-30 NOTE — ED Provider Notes (Signed)
[facility] EMERGENCY DEPARTMENT Provider Note   CSN: [id number] Arrival date & time: 08/30/20  4166     History No chief complaint on file.   Jill Woods is a 20 y.o. female.  She is here with a complaint of allergies.  She says when she goes outside her nose starts running and she sneezes.  She gets this every spring.  She was seen a month ago and given Flonase and loratadine with some improvement but she is out of the loratadine.  She has been using Benadryl but it makes her tired.  She is also tried Careers adviser without any improvement.  The history is provided by the patient.  URI Presenting symptoms: rhinorrhea   Presenting symptoms: no fever   Severity:  Moderate Onset quality:  Gradual Timing:  Intermittent Progression:  Unchanged Chronicity:  Recurrent Relieved by:  Nothing Exacerbated by: pollen. Ineffective treatments:  Prescription medications Associated symptoms: sneezing        Past Medical History:  Diagnosis Date  . Allergies     There are no problems to display for this patient.   Past Surgical History:  Procedure Laterality Date  . CESAREAN SECTION N/A 07/24/2018   Procedure: CESAREAN SECTION;  Surgeon: Lazaro Arms, MD;  Location: [facility] LD [facility];  Service: Obstetrics;  Laterality: N/A;     OB History    Gravida  1   Para      Term      Preterm      AB      Living        SAB      IAB      Ectopic      Multiple      Live Births              History reviewed. No pertinent family history.  Social History   Tobacco Use  . Smoking status: Never Smoker  . Smokeless tobacco: Never Used  Vaping Use  . Vaping Use: Never used  Substance Use Topics  . Alcohol use: Never  . Drug use: Never    Home Medications Prior to Admission medications   Medication Sig Start Date End Date Taking? Authorizing Provider  ferrous sulfate 325 (65 FE) MG EC tablet Take 1 tablet (325 mg total) by mouth daily. 07/26/18  07/26/19  Gwenevere Abbot, MD  fluticasone (FLONASE) 50 MCG/ACT nasal spray Place 1 spray into both nostrils daily. 07/16/20   Kugler, [place], MD  ibuprofen (ADVIL,MOTRIN) 800 MG tablet Take 1 tablet (800 mg total) by mouth every 6 (six) hours. 07/26/18   Gwenevere Abbot, MD  loratadine (CLARITIN) 10 MG tablet Take 1 tablet (10 mg total) by mouth daily. 07/16/20   Kugler, [place], MD  Prenatal Vit-Fe Fumarate-FA (PRENATAL VITAMINS) 28-0.8 MG TABS Take 1 tablet by mouth daily. 09/29/18   Hermina Staggers, MD    Allergies    Pollen extract  Review of Systems   Review of Systems  Constitutional: Negative for fever.  HENT: Positive for rhinorrhea and sneezing.     Physical Exam Updated Vital Signs BP 100/62 (BP Location: Left Arm)   Pulse 80   Temp 99 F (37.2 C) (Oral)   Resp 18   LMP 08/16/2020   SpO2 99%   Physical Exam Vitals and nursing note reviewed.  Constitutional:      Appearance: Normal appearance. She is well-developed.  HENT:     Head: Normocephalic and atraumatic.     Right Ear: Tympanic  membrane normal.     Left Ear: Tympanic membrane normal.     Nose: Nose normal.     Mouth/Throat:     Mouth: Mucous membranes are moist.     Pharynx: Oropharynx is clear.  Eyes:     Conjunctiva/sclera: Conjunctivae normal.  Cardiovascular:     Rate and Rhythm: Normal rate and regular rhythm.  Pulmonary:     Effort: Pulmonary effort is normal.     Breath sounds: Normal breath sounds.  Musculoskeletal:     Cervical back: Neck supple.  Skin:    General: Skin is warm and dry.  Neurological:     General: No focal deficit present.     Mental Status: She is alert.     GCS: GCS eye subscore is 4. GCS verbal subscore is 5. GCS motor subscore is 6.     ED Results / Procedures / Treatments   Labs (all labs ordered are listed, but only abnormal results are displayed) Labs Reviewed - No data to display  EKG None  Radiology No results found.  Procedures Procedures    Medications Ordered in ED Medications - No data to display  ED Course  I have reviewed the triage vital signs and the nursing notes.  Pertinent labs & imaging results that were available during my care of the patient were reviewed by me and considered in my medical decision making (see chart for details).    MDM Rules/Calculators/A&P                          Well-appearing female here with nasal congestion sneezing in the setting of seasonal allergies.  Pulse ox 99% on room air.  Do not feel she needs any lab work or imaging at this time.  She said that the loratadine did not my very much nor did the Flonase but she wants a refill of her loratadine.  She did not have any improvement with Allegra in the past.  I have provided her a prescription of the loratadine. Final Clinical Impression(s) / ED Diagnoses Final diagnoses:  Seasonal allergic rhinitis due to pollen    Rx / DC Orders ED Discharge Orders         Ordered    loratadine (CLARITIN) 10 MG tablet  Daily        08/30/20 0858           Terrilee Files, MD 08/30/20 1725

## 2020-08-31 ENCOUNTER — Telehealth: Payer: Self-pay | Admitting: *Deleted

## 2020-08-31 NOTE — Telephone Encounter (Signed)
Transition Care Management Follow-up Telephone Call  Date of discharge and from where: 08/30/2020 [facility]  How have you been since you were released from the hospital? "No change"  Any questions or concerns? No  Items Reviewed:  Did the pt receive and understand the discharge instructions provided? Yes   Medications obtained and verified? Yes   Other? No   Any new allergies since your discharge? No   Dietary orders reviewed? No  Do you have support at home? No    Functional Questionnaire: (I = Independent and D = Dependent) ADLs: I  Bathing/Dressing- I  Meal Prep- I  Eating- I  Maintaining continence- I  Transferring/Ambulation- I  Managing Meds- I  Follow up appointments reviewed:   PCP Hospital f/u appt confirmed? No    Specialist Hospital f/u appt confirmed? No    Are transportation arrangements needed? N/A  If their condition worsens, is the pt aware to call PCP or go to the Emergency Dept.? Yes  Was the patient provided with contact information for the PCP's office or ED? Yes  Was to pt encouraged to call back with questions or concerns? Yes

## 2020-09-16 IMAGING — US US MFM FETAL BPP WO NON STRESS
1 series · 15 of 28 positions shown · non-contrast
Comparison: none

[Series 1: us mfm fetal bpp wo non stress · 29 acquisitions, 15 frames shown]
[im 1/29]
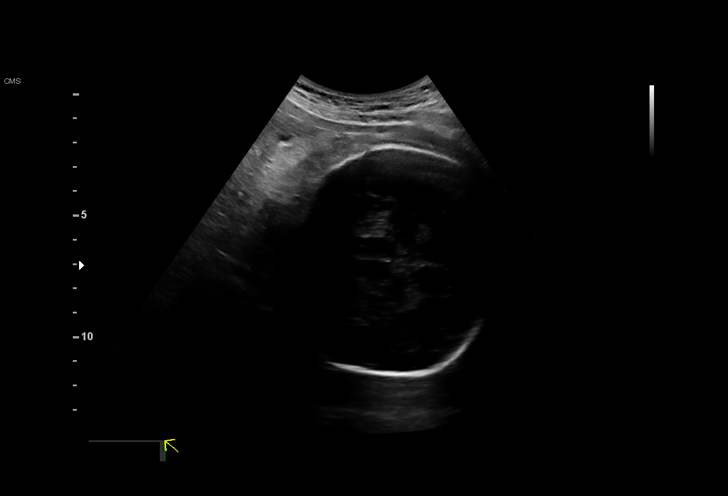
[im 3/29]
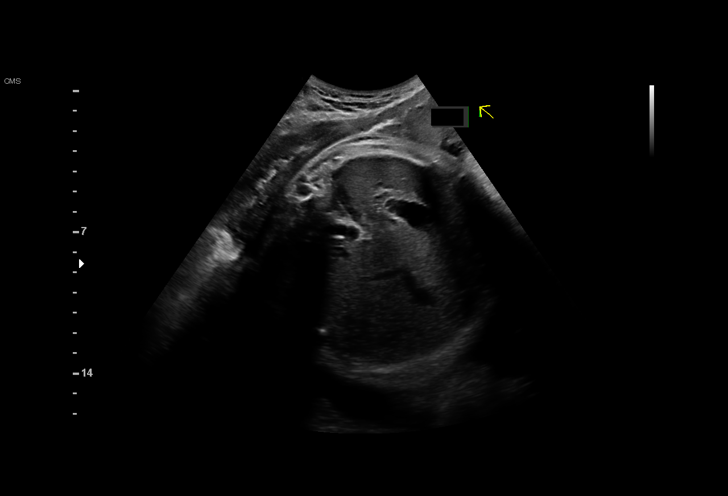
[im 5/29]
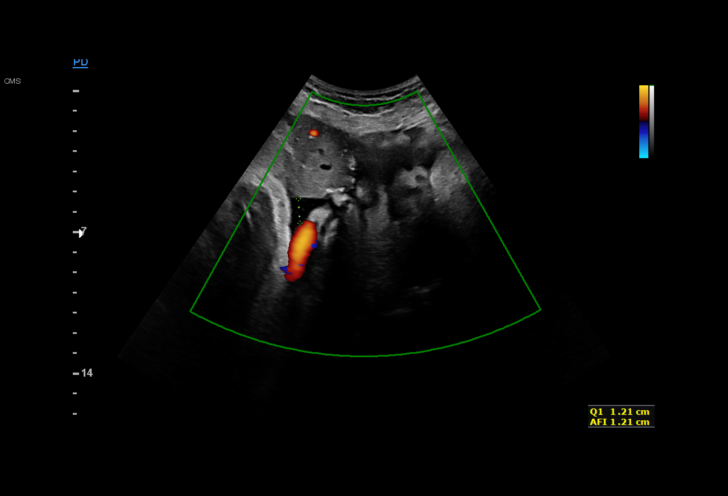
[im 7/29]
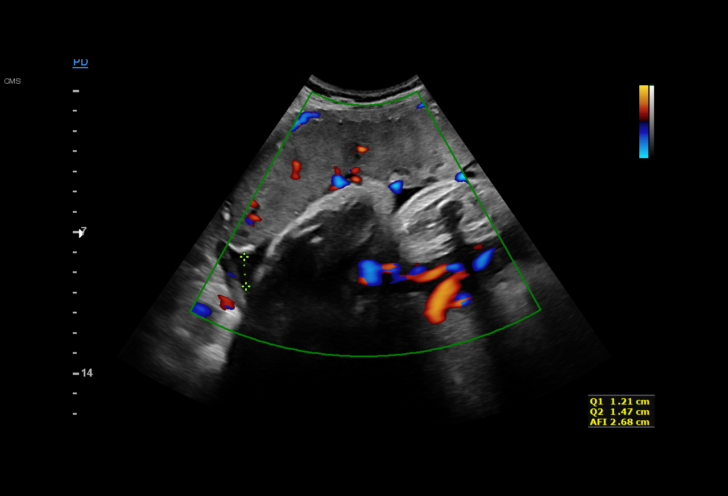
[im 9/29]
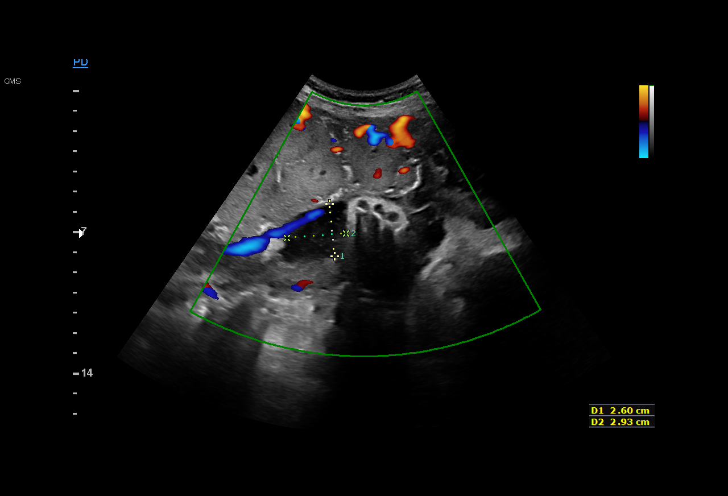
[im 11/29]
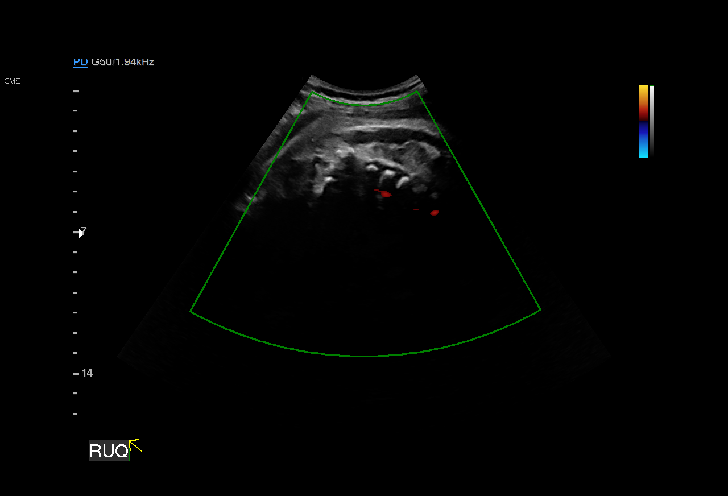
[im 13/29]
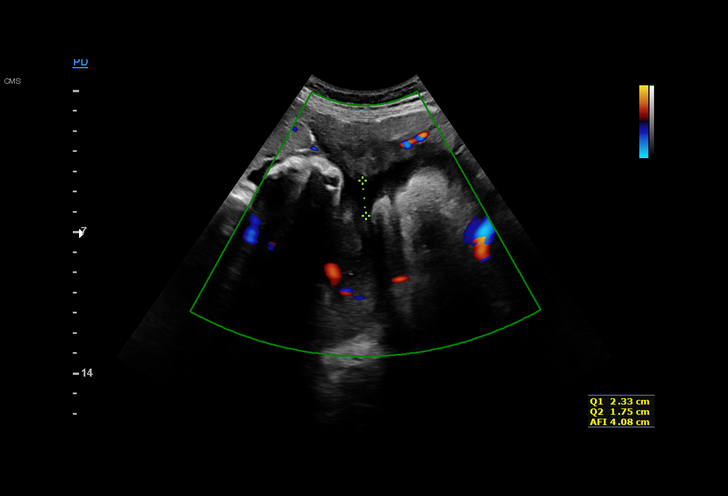
[im 15/29]
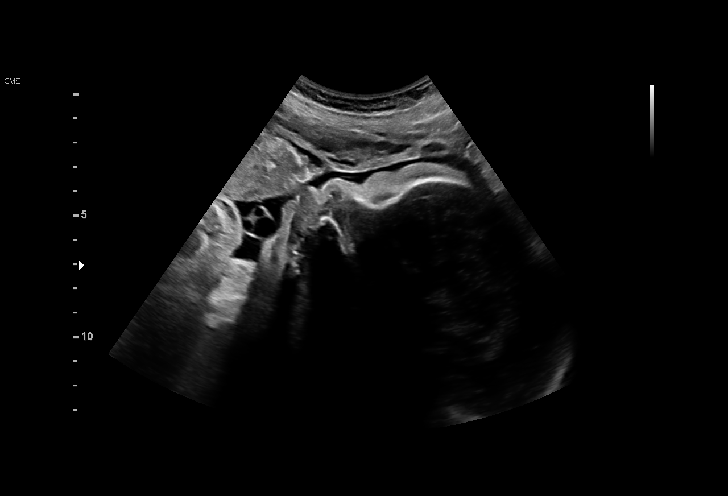
[im 16/29]
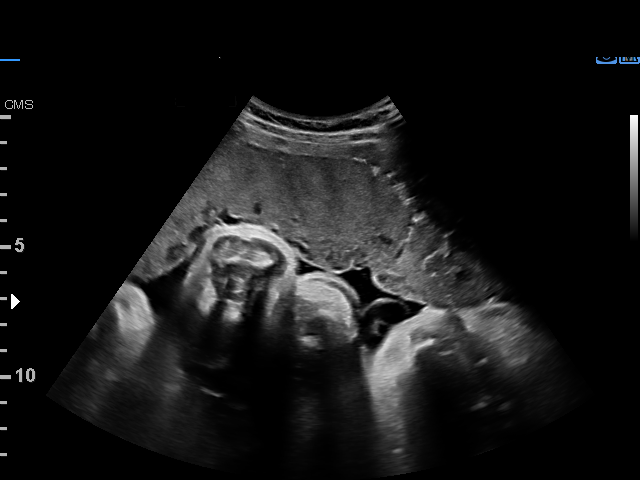
[im 18/29]
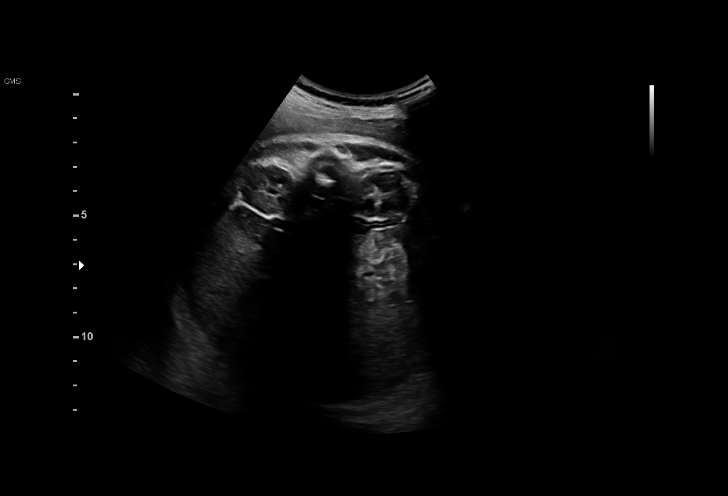
[im 20/29]
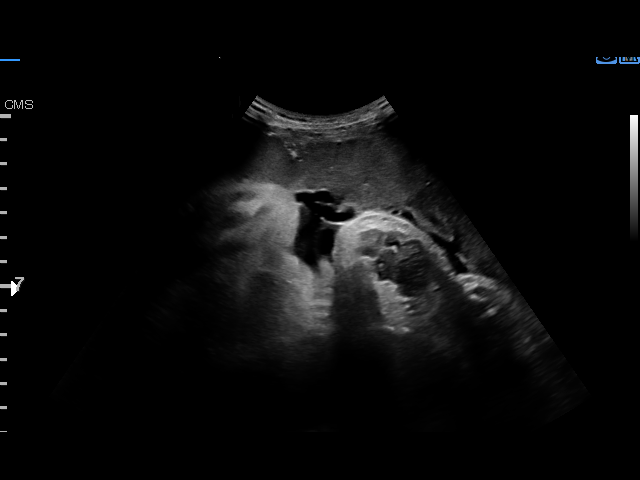
[im 22/29]
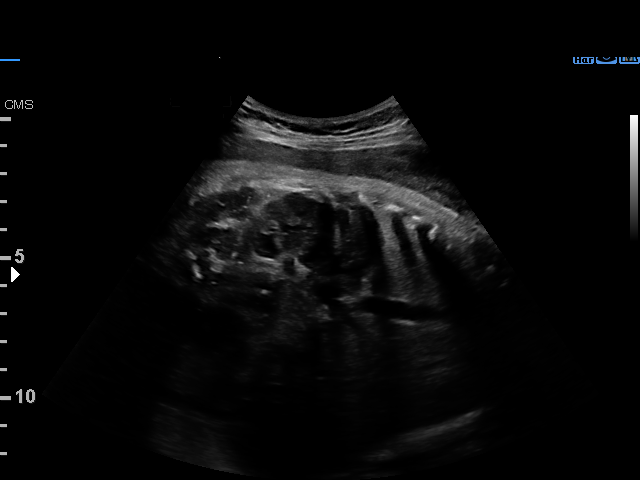
[im 24/29]
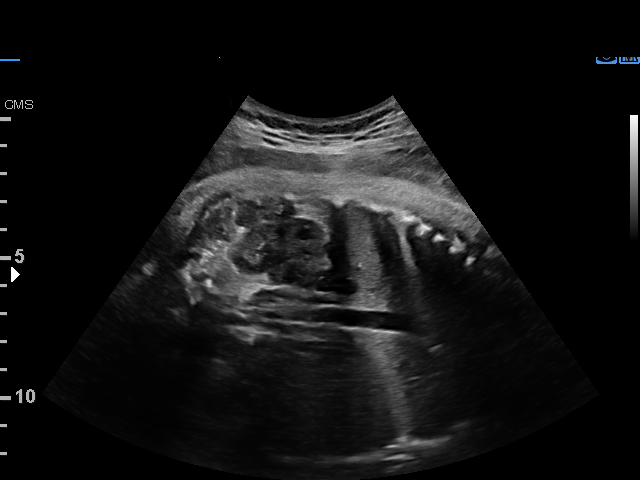
[im 26/29]
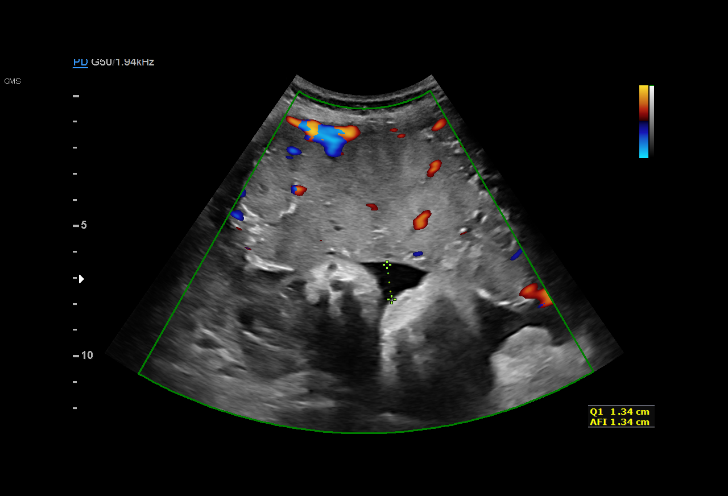
[im 29/29]
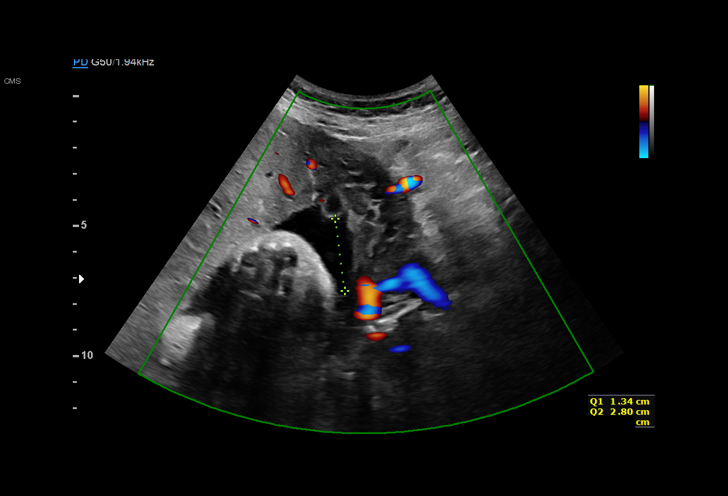

[15 of 28 positions shown; findings below may reference images not displayed]

FONTI

                    CNM

 ----------------------------------------------------------------------

 ----------------------------------------------------------------------
Indications

  39 weeks gestation of pregnancy
  Decreased fetal movements, third trimester,
  unspecified
 ----------------------------------------------------------------------
Vital Signs

                                                 Height:        4'11"
Fetal Evaluation

 Num Of Fetuses:          1
 Fetal Heart Rate(bpm):   161
 Cardiac Activity:        Observed
 Presentation:            Cephalic

 Amniotic Fluid
 AFI FV:      Oligohydramnios

 AFI Sum(cm)     %Tile       Largest Pocket(cm)
 4.14            < 3

 RUQ(cm)       RLQ(cm)        LUQ(cm)        LLQ(cm)
 0             0
Biophysical Evaluation
 Amniotic F.V:   Pocket => 2 cm two          F. Tone:         Observed
                 planes
 F. Movement:    Observed                    Score:           [DATE]
 F. Breathing:   Observed
OB History

 Gravidity:     1
Gestational Age

 LMP:            37w 4d       Date:  11/02/17                   EDD:  08/09/18
 Best:           39w 1d    Det. By:  Previous Ultrasound        EDD:  07/29/18
                                     (02/22/18)
Impression

 Biophysical profile [DATE]
Recommendations

 Consider delivery given term pregnancy and oligohydramnios

## 2020-09-30 ENCOUNTER — Emergency Department (HOSPITAL_COMMUNITY)
Admission: EM | Admit: 2020-09-30 | Discharge: 2020-09-30 | Disposition: A | Discharge status: Home / Self Care | Payer: Medicaid Other | Attending: Emergency Medicine | Admitting: Emergency Medicine

## 2020-09-30 ENCOUNTER — Encounter (HOSPITAL_COMMUNITY): Payer: Self-pay | Admitting: Pharmacy Technician

## 2020-09-30 ENCOUNTER — Other Ambulatory Visit: Payer: Self-pay

## 2020-09-30 DIAGNOSIS — R519 Headache, unspecified: Secondary | ICD-10-CM | POA: Diagnosis not present

## 2020-09-30 NOTE — ED Triage Notes (Signed)
Pt here with reports of L sided headache onset today. Denies hx migraines. Took OTC meds without relief.

## 2020-09-30 NOTE — ED Provider Notes (Signed)
[facility] EMERGENCY DEPARTMENT Provider Note   CSN: [id number] Arrival date & time: 09/30/20  1027     History Chief Complaint  Patient presents with  . Headache    Jill Woods is a 20 y.o. female.  20 year old female presents with cute onset of left-sided headache which is since resolved.  This occurred when she was at work and stated she has some flashes of lights is gone now.  Denies any fever or chills.  No neck pain.  Did not taking medication prior to arrival.  Feels back to her baseline.  Denies any associate neurological features.  Denies any vision loss        Past Medical History:  Diagnosis Date  . Allergies     There are no problems to display for this patient.   Past Surgical History:  Procedure Laterality Date  . CESAREAN SECTION N/A 07/24/2018   Procedure: CESAREAN SECTION;  Surgeon: Lazaro Arms, MD;  Location: [facility] LD [facility]S;  Service: Obstetrics;  Laterality: N/A;     OB History    Gravida  1   Para      Term      Preterm      AB      Living        SAB      IAB      Ectopic      Multiple      Live Births              No family history on file.  Social History   Tobacco Use  . Smoking status: Never Smoker  . Smokeless tobacco: Never Used  Vaping Use  . Vaping Use: Never used  Substance Use Topics  . Alcohol use: Never  . Drug use: Never    Home Medications Prior to Admission medications   Medication Sig Start Date End Date Taking? Authorizing Provider  ferrous sulfate 325 (65 FE) MG EC tablet Take 1 tablet (325 mg total) by mouth daily. 07/26/18 07/26/19  Gwenevere Abbot, MD  fluticasone (FLONASE) 50 MCG/ACT nasal spray Place 1 spray into both nostrils daily. 07/16/20   Kugler, [place], MD  ibuprofen (ADVIL,MOTRIN) 800 MG tablet Take 1 tablet (800 mg total) by mouth every 6 (six) hours. 07/26/18   Gwenevere Abbot, MD  loratadine (CLARITIN) 10 MG tablet Take 1 tablet (10 mg total) by mouth  daily. 08/30/20   Terrilee Files, MD  Prenatal Vit-Fe Fumarate-FA (PRENATAL VITAMINS) 28-0.8 MG TABS Take 1 tablet by mouth daily. 09/29/18   Hermina Staggers, MD    Allergies    Pollen extract  Review of Systems   Review of Systems  All other systems reviewed and are negative.   Physical Exam Updated Vital Signs BP 96/67 (BP Location: Left Arm)   Pulse 71   Temp 98.4 F (36.9 C) (Oral)   Resp 16   LMP 09/30/2020   SpO2 99%   Physical Exam Vitals and nursing note reviewed.  Constitutional:      General: She is not in acute distress.    Appearance: Normal appearance. She is well-developed. She is not toxic-appearing.  HENT:     Head: Normocephalic and atraumatic.  Eyes:     General: Lids are normal.     Conjunctiva/sclera: Conjunctivae normal.     Pupils: Pupils are equal, round, and reactive to light.  Neck:     Thyroid: No thyroid mass.     Trachea: No tracheal deviation.  Cardiovascular:     Rate and Rhythm: Normal rate and regular rhythm.     Heart sounds: Normal heart sounds. No murmur heard. No gallop.   Pulmonary:     Effort: Pulmonary effort is normal. No respiratory distress.     Breath sounds: Normal breath sounds. No stridor. No decreased breath sounds, wheezing, rhonchi or rales.  Abdominal:     General: Bowel sounds are normal. There is no distension.     Palpations: Abdomen is soft.     Tenderness: There is no abdominal tenderness. There is no rebound.  Musculoskeletal:        General: No tenderness. Normal range of motion.     Cervical back: Normal range of motion and neck supple.  Skin:    General: Skin is warm and dry.     Findings: No abrasion or rash.  Neurological:     General: No focal deficit present.     Mental Status: She is alert and oriented to person, place, and time.     GCS: GCS eye subscore is 4. GCS verbal subscore is 5. GCS motor subscore is 6.     Cranial Nerves: No cranial nerve deficit.     Sensory: No sensory deficit.   Psychiatric:        Speech: Speech normal.        Behavior: Behavior normal.     ED Results / Procedures / Treatments   Labs (all labs ordered are listed, but only abnormal results are displayed) Labs Reviewed - No data to display  EKG None  Radiology No results found.  Procedures Procedures   Medications Ordered in ED Medications - No data to display  ED Course  I have reviewed the triage vital signs and the nursing notes.  Pertinent labs & imaging results that were available during my care of the patient were reviewed by me and considered in my medical decision making (see chart for details).    MDM Rules/Calculators/A&P                          Patient with normal neurological function at this time.  She has likely had a migraine.  [place] for imaging.  Return precautions given Final Clinical Impression(s) / ED Diagnoses Final diagnoses:  None    Rx / DC Orders ED Discharge Orders    None       Lorre Nick, MD 09/30/20 1238

## 2020-10-03 ENCOUNTER — Telehealth: Payer: Self-pay

## 2020-10-03 NOTE — Telephone Encounter (Signed)
Transition Care Management Follow-up Telephone Call  Date of discharge and from where: 09/30/2020 from [facility]  How have you been since you were released from the hospital? Pt stated that they are feeling much better and has not questions or concerns at this time.   Any questions or concerns? No  Items Reviewed:  Did the pt receive and understand the discharge instructions provided? Yes   Medications obtained and verified? Yes   Other? No   Any new allergies since your discharge? No   Dietary orders reviewed? n/a  Do you have support at home? Yes   Functional Questionnaire: (I = Independent and D = Dependent) ADLs: I  Bathing/Dressing- I  Meal Prep- I  Eating- I  Maintaining continence- I  Transferring/Ambulation- I  Managing Meds- I   Follow up appointments reviewed:   PCP Hospital f/u appt confirmed? No    Specialist Hospital f/u appt confirmed? No    Are transportation arrangements needed? No   If their condition worsens, is the pt aware to call PCP or go to the Emergency Dept.? Yes  Was the patient provided with contact information for the PCP's office or ED? Yes  Was to pt encouraged to call back with questions or concerns? Yes

## 2021-08-22 ENCOUNTER — Encounter (HOSPITAL_COMMUNITY): Payer: Self-pay | Admitting: Emergency Medicine

## 2021-08-22 ENCOUNTER — Other Ambulatory Visit: Payer: Self-pay

## 2021-08-22 ENCOUNTER — Emergency Department (HOSPITAL_COMMUNITY)
Admission: EM | Admit: 2021-08-22 | Discharge: 2021-08-22 | Disposition: A | Discharge status: Home / Self Care | Payer: Medicaid Other | Attending: Emergency Medicine | Admitting: Emergency Medicine

## 2021-08-22 DIAGNOSIS — R0981 Nasal congestion: Secondary | ICD-10-CM | POA: Diagnosis not present

## 2021-08-22 MED ORDER — LEVOCETIRIZINE DIHYDROCHLORIDE 5 MG PO TABS: 5.0000 mg | ORAL_TABLET | Freq: Every day | ORAL | 0 refills | Status: DC

## 2021-08-22 NOTE — Discharge Instructions (Signed)
Return to the ED with any new symptom such as shortness of breath, chest pain ?Please pick up the prescription at sent in for you.  Please use the coupon I provided. ?Please stop taking Zyrtec or Claritin ?Please begin taking Flonase.  This is a once daily intranasal corticosteroid ?Please follow-up with the PCP I referred you to for any ongoing needs ?

## 2021-08-22 NOTE — ED Provider Notes (Signed)
?[facility] EMERGENCY DEPARTMENT ?Provider Note ? ? ?CSN: [id number] ?Arrival date & time: 08/22/21  1156 ? ?  ? ?History ? ?Chief Complaint  ?Patient presents with  ? Nasal Congestion  ? ? ?Jill Woods is a 21 y.o. female with no medical history.  The patient presents ED for evaluation of nasal congestion.  The patient states that she was recently seen at urgent care and placed on Zyrtec for allergies however these have not been alleviating her symptoms.  Patient states that she is coming back today for further management.  The patient denies any fevers, nausea, vomit, chest pain, shortness of breath, sore throat, cough. ? ?HPI ? ?  ? ?Home Medications ?Prior to Admission medications   ?Medication Sig Start Date End Date Taking? Authorizing Provider  ?levocetirizine (XYZAL) 5 MG tablet Take 1 tablet (5 mg total) by mouth daily. 08/22/21  Yes Al Decant, PA-C  ?ferrous sulfate 325 (65 FE) MG EC tablet Take 1 tablet (325 mg total) by mouth daily. 07/26/18 07/26/19  Gwenevere Abbot, MD  ?fluticasone Aleda Grana) 50 MCG/ACT nasal spray Place 1 spray into both nostrils daily. 07/16/20   Kugler, [place], MD  ?ibuprofen (ADVIL,MOTRIN) 800 MG tablet Take 1 tablet (800 mg total) by mouth every 6 (six) hours. 07/26/18   Gwenevere Abbot, MD  ?loratadine (CLARITIN) 10 MG tablet Take 1 tablet (10 mg total) by mouth daily. 08/30/20   Terrilee Files, MD  ?Prenatal Vit-Fe Fumarate-FA (PRENATAL VITAMINS) 28-0.8 MG TABS Take 1 tablet by mouth daily. 09/29/18   Hermina Staggers, MD  ?   ? ?Allergies    ?Pollen extract   ? ?Review of Systems   ?Review of Systems  ?HENT:  Positive for congestion.   ?All other systems reviewed and are negative. ? ?Physical Exam ?Updated Vital Signs ?BP 116/68   Pulse 89   Temp 98.5 ?F (36.9 ?C) (Oral)   Resp 16   Ht 5' (1.524 m)   Wt 56.7 kg   LMP 08/19/2021   SpO2 100%   BMI 24.41 kg/m?  ?Physical Exam ?Vitals and nursing note reviewed.  ?Constitutional:   ?    General: She is not in acute distress. ?   Appearance: Normal appearance. She is not ill-appearing, toxic-appearing or diaphoretic.  ?HENT:  ?   Head: Normocephalic and atraumatic.  ?   Nose: Nose normal. No congestion.  ?   Mouth/Throat:  ?   Mouth: Mucous membranes are moist.  ?   Pharynx: Oropharynx is clear.  ?Eyes:  ?   Extraocular Movements: Extraocular movements intact.  ?   Conjunctiva/sclera: Conjunctivae normal.  ?   Pupils: Pupils are equal, round, and reactive to light.  ?Cardiovascular:  ?   Rate and Rhythm: Normal rate and regular rhythm.  ?Pulmonary:  ?   Effort: Pulmonary effort is normal.  ?   Breath sounds: Normal breath sounds. No wheezing.  ?Abdominal:  ?   General: Abdomen is flat. Bowel sounds are normal.  ?   Palpations: Abdomen is soft.  ?   Tenderness: There is no abdominal tenderness.  ?Musculoskeletal:  ?   Cervical back: Normal range of motion and neck supple. No tenderness.  ?Skin: ?   General: Skin is warm and dry.  ?   Capillary Refill: Capillary refill takes less than 2 seconds.  ?Neurological:  ?   Mental Status: She is alert and oriented to person, place, and time.  ? ? ?ED Results / Procedures / Treatments   ?  Labs ?(all labs ordered are listed, but only abnormal results are displayed) ?Labs Reviewed - No data to display ? ?EKG ?None ? ?Radiology ?No results found. ? ?Procedures ?Procedures  ? ? ?Medications Ordered in ED ?Medications - No data to display ? ?ED Course/ Medical Decision Making/ A&P ?  ?                        ?Medical Decision Making ? ?21 year old female presents ED for evaluation of nasal congestion.  Please see HPI for further details. ? ?On examination, the patient is afebrile, nontachycardic but not hypoxic.  Patient lung sounds are clear bilaterally.  Patient abdomen soft compressible all 4 quadrants.  Patient oropharynx shows no signs of exudate, erythema, tonsillar swelling.  The patient is handling secretions appropriately. ? ?Patient given prescription for  Xyzal, advised to purchase Flonase.  Patient referred to PCP for further management. ? ?Patient given return precautions and she voiced understanding.  The patient had all of her questions answered to her satisfaction.  The patient is stable for discharge. ? ? ?Final Clinical Impression(s) / ED Diagnoses ?Final diagnoses:  ?Nasal congestion  ? ? ?Rx / DC Orders ?ED Discharge Orders   ? ?      Ordered  ?  levocetirizine (XYZAL) 5 MG tablet  Daily       ? 08/22/21 1236  ? ?  ?  ? ?  ? ? ?  ?Al Decant, PA-C ?08/22/21 1322 ? ?  ?Jacalyn Lefevre, MD ?08/22/21 1344 ? ?

## 2021-08-22 NOTE — ED Triage Notes (Signed)
Patient c/o allergies bothering her for months. Reports runny nose and sneezing. Was seen here in past and given claritin but not helping.  ?

## 2021-08-23 ENCOUNTER — Telehealth: Payer: Self-pay

## 2021-08-23 NOTE — Telephone Encounter (Signed)
Transition Care Management Unsuccessful Follow-up Telephone Call ? ?Date of discharge and from where:  08/22/2021 from [place] ? ?Attempts:  1st Attempt ? ?Reason for unsuccessful TCM follow-up call:  Left voice message ? ? ? ?

## 2021-08-24 NOTE — Telephone Encounter (Signed)
Transition Care Management Unsuccessful Follow-up Telephone Call ? ?Date of discharge and from where:  08/22/2021-[place]  ? ?Attempts:  2nd Attempt ? ?Reason for unsuccessful TCM follow-up call:  Left voice message ? ?  ?

## 2021-08-25 NOTE — Telephone Encounter (Signed)
Transition Care Management Unsuccessful Follow-up Telephone Call ? ?Date of discharge and from where:   08/22/2021-[place]  ? ?Attempts:  3rd Attempt ? ?Reason for unsuccessful TCM follow-up call:  Unable to reach patient ? ? ? ?

## 2021-11-01 ENCOUNTER — Emergency Department (HOSPITAL_COMMUNITY)
Admission: EM | Admit: 2021-11-01 | Discharge: 2021-11-02 | Disposition: A | Discharge status: Home / Self Care | Payer: Medicaid Other | Attending: Emergency Medicine | Admitting: Emergency Medicine

## 2021-11-01 ENCOUNTER — Encounter (HOSPITAL_COMMUNITY): Payer: Self-pay | Admitting: *Deleted

## 2021-11-01 ENCOUNTER — Other Ambulatory Visit: Payer: Self-pay

## 2021-11-01 DIAGNOSIS — Z3046 Encounter for surveillance of implantable subdermal contraceptive: Secondary | ICD-10-CM | POA: Insufficient documentation

## 2021-11-01 DIAGNOSIS — Z760 Encounter for issue of repeat prescription: Secondary | ICD-10-CM | POA: Diagnosis not present

## 2021-11-01 NOTE — ED Provider Notes (Signed)
  [facility]-EMERGENCY DEPT [facility] Emergency Department Provider Note MRN:  [id number]  Arrival date & time: 11/02/21     Chief Complaint   bc implant painful   History of Present Illness   Jill Woods is a 21 y.o. year-old female presents to the ED with chief complaint of needing her nexplanon removed.  She states that she hasn't been able to get an appointment with the health department.  States that it was supposed to come out in 4 months ago.  She denies any fevers, chills, or pain.  History provided by patient.   Review of Systems  Pertinent review of systems noted in HPI.    Physical Exam   Vitals:   11/01/21 2315 11/01/21 2330  BP: 104/72 104/67  Pulse: 86 77  Resp: 16 16  Temp:  98.4 F (36.9 C)  SpO2: 99% 98%    CONSTITUTIONAL:  well-appearing, NAD NEURO:  Alert and oriented x 3, CN 3-12 grossly intact EYES:  eyes equal and reactive ENT/NECK:  Supple, no stridor  CARDIO:  appears well-perfused, intact distal pulses PULM:  No respiratory distress, GI/GU:  non-distended,  MSK/SPINE:  No gross deformities, no edema, moves all extremities  SKIN:  no rash, atraumatic, palpable implant in LUE, no erythema or sign of infection   *Additional and/or pertinent findings included in MDM below  Diagnostic and Interventional Summary    EKG Interpretation  Date/Time:    Ventricular Rate:    PR Interval:    QRS Duration:   QT Interval:    QTC Calculation:   R Axis:     Text Interpretation:         Labs Reviewed - No data to display  No orders to display    Medications - No data to display   Procedures  /  Critical Care Procedures  ED Course and Medical Decision Making  I have reviewed the triage vital signs, the nursing notes, and pertinent available records from the EMR.  Social Determinants Affecting Complexity of Care: Patient has decreased access to medical care.   ED Course:   Patient here with needing Nexplanon removed.    Medical Decision Making Patient here needing Nexplanon removed.  I discussed with patient that we do not do this in the emergency department.  She has no sign of infection.  No complications.  Despite triage note, there is no bruising on my exam.  Consultants: I consulted with Hilda Lias, APP from OB/GYN, who [facility]  I gave patient contact information.   Treatment and Plan: Emergency department workup does not suggest an emergent condition requiring admission or immediate intervention beyond  what has been performed at this time. The patient is safe for discharge and has  been instructed to return immediately for worsening symptoms, change in  symptoms or any other concerns    Final Clinical Impressions(s) / ED Diagnoses     ICD-10-CM   1. Encounter for surveillance of Nexplanon subdermal contraceptive  Z30.46       ED Discharge Orders     None         Discharge Instructions Discussed with and Provided to Patient:     Discharge Instructions      Please follow-up at the [facility].  The number and address are listed.       Roxy Horseman, PA-C 11/02/21 0029    Pollyann Savoy, MD 11/02/21 [phone]

## 2021-11-01 NOTE — ED Triage Notes (Signed)
Lt arm has an birth control device in her lt upper arm  it has been  there for 3 years  now she reports that the area is bruising

## 2021-11-01 NOTE — ED Provider Triage Note (Signed)
Emergency Medicine Provider Triage Evaluation Note  Jill Woods , a 21 y.o. female  was evaluated in triage.  Pt complains of wanting to have her Nexplanon removed.  She had this placed in March 2020.  She is trying to get in touch with her OB/GYN but they are not able to give her an appointment at all apparently.  She denies any symptoms.  Review of Systems  Positive: None Negative:   Physical Exam  There were no vitals taken for this visit. Gen:   Awake, no distress   Resp:  Normal effort  MSK:   Moves extremities without difficulty  Other:    Medical Decision Making  Medically screening exam initiated at 4:02 PM.  Appropriate orders placed.  Eulogia Cabrera-Saucedo was informed that the remainder of the evaluation will be completed by another provider, this initial triage assessment does not replace that evaluation, and the importance of remaining in the ED until their evaluation is complete.  I did inform the patient that we do not remove Nexplanon in the ER. Offered referral to our group of [facility] providers. She would like to know what other options she has.   Dietrich Pates, PA-C 11/01/21 1604

## 2021-11-02 NOTE — Discharge Instructions (Signed)
Please follow-up at the [facility].  The number and address are listed.

## 2021-11-02 NOTE — ED Notes (Signed)
Patient verbalizes understanding of d/c instructions. Opportunities for questions and answers were provided. Pt d/c from ED and ambulated to lobby.  

## 2022-01-05 ENCOUNTER — Other Ambulatory Visit: Payer: Self-pay

## 2022-01-05 ENCOUNTER — Encounter (HOSPITAL_COMMUNITY): Payer: Self-pay

## 2022-01-05 ENCOUNTER — Emergency Department (HOSPITAL_COMMUNITY)
Admission: EM | Admit: 2022-01-05 | Discharge: 2022-01-05 | Disposition: A | Discharge status: Home / Self Care | Payer: Medicaid Other | Attending: Emergency Medicine | Admitting: Emergency Medicine

## 2022-01-05 DIAGNOSIS — G43909 Migraine, unspecified, not intractable, without status migrainosus: Secondary | ICD-10-CM | POA: Diagnosis not present

## 2022-01-05 DIAGNOSIS — G43809 Other migraine, not intractable, without status migrainosus: Secondary | ICD-10-CM | POA: Diagnosis not present

## 2022-01-05 NOTE — ED Provider Notes (Signed)
[facility]-EMERGENCY DEPT Provider Note   CSN: [id number] Arrival date & time: 01/05/22  0955     History  Chief Complaint  Patient presents with   Migraine   Numbness    Jill Woods is a 21 y.o. female.  21 year old female with history of migraines presents left-sided headache consistent with her prior migraine equivalent.  States that she was at work and is not very ventilated became very warm.  She states that this triggered her migraine.  She did not have any nausea or vomiting.  Did have some slight facial numbness on the left side as well as some right-sided paresthesias.  She states that she gets this when she has her migraines.  Her symptoms greatly improved when she went outside and it was cooler.  She currently feels back to her baseline.  Denies any trouble walking.  Did not take any medications prior to arrival       Home Medications Prior to Admission medications   Medication Sig Start Date End Date Taking? Authorizing Provider  ferrous sulfate 325 (65 FE) MG EC tablet Take 1 tablet (325 mg total) by mouth daily. 07/26/18 07/26/19  Gwenevere Abbot, MD  fluticasone (FLONASE) 50 MCG/ACT nasal spray Place 1 spray into both nostrils daily. 07/16/20   Kugler, [place], MD  ibuprofen (ADVIL,MOTRIN) 800 MG tablet Take 1 tablet (800 mg total) by mouth every 6 (six) hours. 07/26/18   Gwenevere Abbot, MD  levocetirizine (XYZAL) 5 MG tablet Take 1 tablet (5 mg total) by mouth daily. 08/22/21   Al Decant, PA-C  loratadine (CLARITIN) 10 MG tablet Take 1 tablet (10 mg total) by mouth daily. 08/30/20   Terrilee Files, MD  Prenatal Vit-Fe Fumarate-FA (PRENATAL VITAMINS) 28-0.8 MG TABS Take 1 tablet by mouth daily. 09/29/18   Hermina Staggers, MD      Allergies    Pollen extract    Review of Systems   Review of Systems  All other systems reviewed and are negative.   Physical Exam Updated Vital Signs BP 104/63 (BP Location: Right Arm)   Pulse  71   Temp 98.4 F (36.9 C) (Oral)   Resp 18   SpO2 97%  Physical Exam Vitals and nursing note reviewed.  Constitutional:      General: She is not in acute distress.    Appearance: Normal appearance. She is well-developed. She is not toxic-appearing.  HENT:     Head: Normocephalic and atraumatic.  Eyes:     General: Lids are normal.     Conjunctiva/sclera: Conjunctivae normal.     Pupils: Pupils are equal, round, and reactive to light.  Neck:     Thyroid: No thyroid mass.     Trachea: No tracheal deviation.  Cardiovascular:     Rate and Rhythm: Normal rate and regular rhythm.     Heart sounds: Normal heart sounds. No murmur heard.    No gallop.  Pulmonary:     Effort: Pulmonary effort is normal. No respiratory distress.     Breath sounds: Normal breath sounds. No stridor. No decreased breath sounds, wheezing, rhonchi or rales.  Abdominal:     General: There is no distension.     Palpations: Abdomen is soft.     Tenderness: There is no abdominal tenderness. There is no rebound.  Musculoskeletal:        General: No tenderness. Normal range of motion.     Cervical back: Normal range of motion and neck supple.  Skin:  General: Skin is warm and dry.     Findings: No abrasion or rash.  Neurological:     General: No focal deficit present.     Mental Status: She is alert and oriented to person, place, and time. Mental status is at baseline.     GCS: GCS eye subscore is 4. GCS verbal subscore is 5. GCS motor subscore is 6.     Cranial Nerves: Cranial nerves 2-12 are intact. No cranial nerve deficit.     Sensory: No sensory deficit.     Motor: Motor function is intact.     Gait: Gait is intact.  Psychiatric:        Attention and Perception: Attention normal.        Speech: Speech normal.        Behavior: Behavior normal.     ED Results / Procedures / Treatments   Labs (all labs ordered are listed, but only abnormal results are displayed) Labs Reviewed - No data to  display  EKG None  Radiology No results found.  Procedures Procedures    Medications Ordered in ED Medications - No data to display  ED Course/ Medical Decision Making/ A&P                           Medical Decision Making  Patient here with typical migraine.  Her headache is controlled at this time.  She is not requesting any medication for pain.  Her neurological exam is normal.  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  Considered other serious etiologies such as stroke versus subarachnoid but feel less likely.  She is well-appearing.  Do not feel that she needs any intracranial imaging.  Plan will be to discharge home.  Return precautions given        Final Clinical Impression(s) / ED Diagnoses Final diagnoses:  None    Rx / DC Orders ED Discharge Orders     None         Lorre Nick, MD 01/05/22 1038

## 2022-01-05 NOTE — ED Triage Notes (Signed)
Pt reports while she was at work this morning she began to have severe left head pain and then noticed blurred vision to R eye. Pt endorses R  arm numbness that began shortly after that. Upon exam pt has lessened sensation in R arm and leg. Without extremity weakness, facial droop,  and slurred speech.

## 2022-01-18 ENCOUNTER — Encounter: Payer: Self-pay | Admitting: Obstetrics and Gynecology

## 2022-01-18 ENCOUNTER — Ambulatory Visit: Payer: Medicaid Other | Admitting: Obstetrics and Gynecology

## 2022-01-18 ENCOUNTER — Other Ambulatory Visit (HOSPITAL_COMMUNITY)
Admission: RE | Admit: 2022-01-18 | Discharge: 2022-01-18 | Disposition: A | Discharge status: Home / Self Care | Payer: Medicaid Other | Source: Ambulatory Visit | Attending: Obstetrics and Gynecology | Admitting: Obstetrics and Gynecology

## 2022-01-18 VITALS — BP 112/65 | HR 72 | Ht 60.0 in | Wt 119.0 lb

## 2022-01-18 DIAGNOSIS — Z3046 Encounter for surveillance of implantable subdermal contraceptive: Secondary | ICD-10-CM

## 2022-01-18 DIAGNOSIS — Z124 Encounter for screening for malignant neoplasm of cervix: Secondary | ICD-10-CM | POA: Diagnosis not present

## 2022-01-18 MED ORDER — ETONOGESTREL 68 MG ~~LOC~~ IMPL
68.0000 mg | DRUG_IMPLANT | Freq: Once | SUBCUTANEOUS | Status: AC
  Administered 2022-01-18: 68 mg via SUBCUTANEOUS

## 2022-01-18 NOTE — Progress Notes (Addendum)
21 y.o presents for Nexplanon removal and Insertion.  She needs her 1st PAP.  Administrations This Visit     etonogestrel (NEXPLANON) implant 68 mg     Admin Date 01/18/2022 Action Given Dose 68 mg Route Subdermal Administered By Tamela Oddi, RMA

## 2022-01-18 NOTE — Progress Notes (Signed)
     GYNECOLOGY OFFICE PROCEDURE NOTE  Jill Woods is a 21 y.o. G1P0 here for Nexplanon removal and Nexplanon re-insertion.   No other gynecologic concerns.   Nexplanon Removal and Reinsertion Patient identified, informed consent performed, consent signed.   Patient does understand that irregular bleeding is a very common side effect of this medication. She was advised to have backup contraception for one week after replacement of the implant. Pregnancy test in clinic today was negative.  Appropriate time out taken. Nexplanon site identified in left arm.  Area prepped in usual sterile fashon. One ml of 1% lidocaine was used to anesthetize the area at the distal end of the implant. A small stab incision was made right beside the implant on the distal portion. The Nexplanon rod was grasped using hemostats and removed without difficulty. There was minimal blood loss. There were no complications. Area was then injected with 3 ml of 1 % lidocaine. She was re-prepped with betadine, Nexplanon removed from packaging, Device confirmed in needle, then inserted full length of needle and withdrawn per handbook instructions. Nexplanon was able to palpated in the patient's arm; patient palpated the insert herself.  There was minimal blood loss. Patient insertion site covered with gauze and a pressure bandage to reduce any bruising. The patient tolerated the procedure well and was given post procedure instructions.  She was advised to have backup contraception for one week.     Pap smear taken today with PA student without incident. Results pending.  Lynnda Shields, MD, [place] [facility], [place]

## 2022-01-23 LAB — CYTOLOGY - PAP
Comment: NEGATIVE
Diagnosis: UNDETERMINED — AB
High risk HPV: NEGATIVE

## 2022-11-13 ENCOUNTER — Other Ambulatory Visit: Payer: Self-pay

## 2022-11-13 ENCOUNTER — Emergency Department (HOSPITAL_COMMUNITY)
Admission: EM | Admit: 2022-11-13 | Discharge: 2022-11-13 | Disposition: A | Discharge status: Home / Self Care | Payer: Medicaid Other | Attending: Emergency Medicine | Admitting: Emergency Medicine

## 2022-11-13 ENCOUNTER — Encounter (HOSPITAL_COMMUNITY): Payer: Self-pay

## 2022-11-13 DIAGNOSIS — R109 Unspecified abdominal pain: Secondary | ICD-10-CM

## 2022-11-13 DIAGNOSIS — R110 Nausea: Secondary | ICD-10-CM | POA: Insufficient documentation

## 2022-11-13 DIAGNOSIS — R197 Diarrhea, unspecified: Secondary | ICD-10-CM | POA: Diagnosis not present

## 2022-11-13 DIAGNOSIS — R1084 Generalized abdominal pain: Secondary | ICD-10-CM | POA: Insufficient documentation

## 2022-11-13 LAB — COMPREHENSIVE METABOLIC PANEL
ALT: 11 U/L (ref 0–44)
AST: 13 U/L — ABNORMAL LOW (ref 15–41)
Albumin: 3.9 g/dL (ref 3.5–5.0)
Alkaline Phosphatase: 96 U/L (ref 38–126)
Anion gap: 6 (ref 5–15)
BUN: 13 mg/dL (ref 6–20)
CO2: 25 mmol/L (ref 22–32)
Calcium: 8.9 mg/dL (ref 8.9–10.3)
Chloride: 103 mmol/L (ref 98–111)
Creatinine, Ser: 0.62 mg/dL (ref 0.44–1.00)
GFR, Estimated: >60 mL/min (ref >60)
Glucose, Bld: 101 mg/dL — ABNORMAL HIGH (ref 70–99)
Potassium: 3.5 mmol/L (ref 3.5–5.1)
Sodium: 134 mmol/L — ABNORMAL LOW (ref 135–145)
Total Bilirubin: 0.5 mg/dL (ref 0.3–1.2)
Total Protein: 6.8 g/dL (ref 6.5–8.1)

## 2022-11-13 LAB — URINALYSIS, ROUTINE W REFLEX MICROSCOPIC
Bilirubin Urine: NEGATIVE
Glucose, UA: NEGATIVE mg/dL
Hgb urine dipstick: NEGATIVE
Ketones, ur: NEGATIVE mg/dL
Leukocytes,Ua: NEGATIVE
Nitrite: NEGATIVE
Protein, ur: NEGATIVE mg/dL
Specific Gravity, Urine: 1.028 (ref 1.005–1.030)
pH: 5 (ref 5.0–8.0)

## 2022-11-13 LAB — CBC
HCT: 38.3 % (ref 36.0–46.0)
Hemoglobin: 13 g/dL (ref 12.0–15.0)
MCH: 30.7 pg (ref 26.0–34.0)
MCHC: 33.9 g/dL (ref 30.0–36.0)
MCV: 90.5 fL (ref 80.0–100.0)
Platelets: 247 10*3/uL (ref 150–400)
RBC: 4.23 MIL/uL (ref 3.87–5.11)
RDW: 11.9 % (ref 11.5–15.5)
WBC: 7.6 10*3/uL (ref 4.0–10.5)
nRBC: 0 % (ref 0.0–0.2)

## 2022-11-13 LAB — HCG, SERUM, QUALITATIVE: Preg, Serum: NEGATIVE

## 2022-11-13 LAB — LIPASE, BLOOD: Lipase: 29 U/L (ref 11–51)

## 2022-11-13 MED ORDER — ONDANSETRON HCL 4 MG PO TABS: 4.0000 mg | ORAL_TABLET | Freq: Four times a day (QID) | ORAL | 0 refills | Status: DC

## 2022-11-13 MED ORDER — POLYETHYLENE GLYCOL 3350 17 GM/SCOOP PO POWD: 1.0000 | Freq: Once | ORAL | 0 refills | Status: AC

## 2022-11-13 MED ORDER — ONDANSETRON HCL 4 MG/2ML IJ SOLN
4.0000 mg | Freq: Once | INTRAMUSCULAR | Status: AC
  Administered 2022-11-13: 4 mg via INTRAVENOUS
  Filled 2022-11-13: qty 2

## 2022-11-13 NOTE — ED Provider Notes (Signed)
[place] EMERGENCY DEPARTMENT AT [facility] Provider Note   CSN: [id number] Arrival date & time: 11/13/22  0631     History  Chief Complaint  Patient presents with   Abdominal Pain    Jill Woods is a 22 y.o. female.  Patient is a healthy 22 year old presenting with abdominal pain. Patient reports generalized abdominal pain for four days. The pain is crampy in nature and throughout the whole abdomen. Tylenol has not been effective in controlling pain. She also reports some nausea without vomiting. Additionally, she had two bouts of diarrhea since yesterday. Previously bowel movements were regular. Last menstrual period was last week. Denies dysuria, fever, chills, chest pain, shortness of breath.    Abdominal Pain Associated symptoms: diarrhea and nausea   Associated symptoms: no chest pain, no chills, no dysuria, no fatigue, no fever, no shortness of breath and no vomiting        Home Medications Prior to Admission medications   Medication Sig Start Date End Date Taking? Authorizing Provider  ferrous sulfate 325 (65 FE) MG EC tablet Take 1 tablet (325 mg total) by mouth daily. 07/26/18 07/26/19  Gwenevere Abbot, MD  fluticasone (FLONASE) 50 MCG/ACT nasal spray Place 1 spray into both nostrils daily. Patient not taking: Reported on 01/18/2022 07/16/20   Kugler, [place], MD  ibuprofen (ADVIL,MOTRIN) 800 MG tablet Take 1 tablet (800 mg total) by mouth every 6 (six) hours. Patient not taking: Reported on 01/18/2022 07/26/18   Gwenevere Abbot, MD  levocetirizine (XYZAL) 5 MG tablet Take 1 tablet (5 mg total) by mouth daily. Patient not taking: Reported on 01/18/2022 08/22/21   Al Decant, PA-C  loratadine (CLARITIN) 10 MG tablet Take 1 tablet (10 mg total) by mouth daily. Patient not taking: Reported on 01/18/2022 08/30/20   Terrilee Files, MD  Prenatal Vit-Fe Fumarate-FA (PRENATAL VITAMINS) 28-0.8 MG TABS Take 1 tablet by mouth daily. Patient not taking:  Reported on 01/18/2022 09/29/18   Hermina Staggers, MD      Allergies    Pollen extract    Review of Systems   Review of Systems  Constitutional:  Negative for chills, fatigue and fever.  Respiratory:  Negative for shortness of breath.   Cardiovascular:  Negative for chest pain.  Gastrointestinal:  Positive for abdominal pain, diarrhea and nausea. Negative for abdominal distention and vomiting.  Genitourinary:  Negative for dysuria.    Physical Exam Updated Vital Signs BP 120/71 (BP Location: Right Arm)   Pulse 81   Temp 98.4 F (36.9 C) (Oral)   Resp 18   LMP  (LMP Unknown)   SpO2 99%  Physical Exam Constitutional:      General: She is not in acute distress. Cardiovascular:     Rate and Rhythm: Normal rate and regular rhythm.  Pulmonary:     Effort: Pulmonary effort is normal. No respiratory distress.     Breath sounds: Normal breath sounds.  Abdominal:     Palpations: Abdomen is soft.     Tenderness: There is generalized abdominal tenderness.  Neurological:     Mental Status: She is alert.     ED Results / Procedures / Treatments   Labs (all labs ordered are listed, but only abnormal results are displayed) Labs Reviewed  CBC  LIPASE, BLOOD  COMPREHENSIVE METABOLIC PANEL  URINALYSIS, ROUTINE W REFLEX MICROSCOPIC  HCG, SERUM, QUALITATIVE    EKG None  Radiology No results found.  Procedures Procedures    Medications Ordered in ED  Medications - No data to display  ED Course/ Medical Decision Making/ A&P                             Medical Decision Making Amount and/or Complexity of Data Reviewed Labs: ordered.  Medical Decision Making:   Jill Woods is a 22 y.o. female who presented to the ED today with abdominal pain detailed above.    Complete initial physical exam performed, notably the patient  was slightly tender to palpation.    Reviewed and confirmed nursing documentation for past medical history, family history, social history.     Initial Assessment:   With the patient's presentation of abdominal pain, most likely diagnosis is constipation. Other diagnoses were considered including (but not limited to) UTI, pancreatitis, ovarian torsion, ovarian cyst. These are considered less likely due to history of present illness and physical exam findings.   This is most consistent with an acute complicated illness  Initial Plan:   Screening labs including CBC and Metabolic panel to evaluate for infectious or metabolic etiology of disease.  Urinalysis with reflex culture ordered to evaluate for UTI or relevant urologic/nephrologic pathology.  Objective evaluation as below reviewed   Initial Study Results:   Laboratory  All laboratory results reviewed without evidence of clinically relevant pathology.   Exceptions include:    Radiology:  All images reviewed independently. Agree with radiology report at this time.   No results found.  Reassessment and Plan:   Patient is a healthy 22 year old presenting with abdominal pain and nausea. Vitals were stable throughout ED stay. Labs including UA were reassuring. On exam, patient was well-appearing and mildly tender to palpation. Patient was given Zofran for nausea. On reassessment, patient was resting comfortably in bed. Patient agreed with plan to be discharged.          Final Clinical Impression(s) / ED Diagnoses Final diagnoses:  None    Rx / DC Orders ED Discharge Orders     None         Monna Fam, MD 11/13/22 8469    Gerhard Munch, MD 11/13/22 [phone]

## 2022-11-13 NOTE — ED Notes (Signed)
Pt sitting up on side of bed. Complains of nausea x 5 days and diarrhea that started yesterday. Denies any vomiting. No fevers. Decline blanket

## 2022-11-13 NOTE — ED Notes (Signed)
Dc instructions and scripts reviewed with patient no questions or concerns at this time.

## 2022-11-13 NOTE — Discharge Instructions (Addendum)
Please use Zofran tablets for nausea and Miralax powder for constipation as needed. Schedule with [facility] community clinic for follow up as soon as possible. Please return to the ED if symptoms worsen.

## 2022-11-13 NOTE — ED Triage Notes (Signed)
Pt arrived from home via POV c/o abd pain 7/10 on pain scale x 4 days. Pt states that she has been nauseated, but zero emesis. Pt states x 2 diarrhea stools that began yesterday.

## 2023-07-26 ENCOUNTER — Other Ambulatory Visit: Payer: Self-pay

## 2023-07-26 ENCOUNTER — Encounter (HOSPITAL_COMMUNITY): Payer: Self-pay

## 2023-07-26 ENCOUNTER — Emergency Department (HOSPITAL_COMMUNITY)
Admission: EM | Admit: 2023-07-26 | Discharge: 2023-07-26 | Disposition: A | Discharge status: Home / Self Care | Payer: Self-pay | Attending: Emergency Medicine | Admitting: Emergency Medicine

## 2023-07-26 DIAGNOSIS — G5603 Carpal tunnel syndrome, bilateral upper limbs: Secondary | ICD-10-CM | POA: Insufficient documentation

## 2023-07-26 MED ORDER — IBUPROFEN 600 MG PO TABS: 600.0000 mg | ORAL_TABLET | Freq: Four times a day (QID) | ORAL | 0 refills | Status: AC | PRN

## 2023-07-26 MED ORDER — IBUPROFEN 400 MG PO TABS
600.0000 mg | ORAL_TABLET | Freq: Once | ORAL | Status: AC
  Administered 2023-07-26: 600 mg via ORAL
  Filled 2023-07-26: qty 1

## 2023-07-26 NOTE — Discharge Instructions (Addendum)
 We evaluated you for your hand pain.  Your symptoms are most likely caused by carpal tunnel syndrome.  This is caused by pinched nerve in your wrist.  This could be made worse by your current job since you use your hands  Please take 600 mg of Motrin every 6 hours as needed for pain.  We have also given you a wrist brace.  Please try to wear this as much as possible, especially when sleeping.  Since you do not have a primary doctor, you can call hand surgery for follow-up.  Please return for any new symptoms such as numbness or tingling in other parts of your body, weakness on one side of your body or the other, trouble speaking, vision changes, trouble swallowing, or rash to your hands, color change in your hands, trouble using your hand, or any other new symptoms.

## 2023-07-26 NOTE — ED Triage Notes (Signed)
 Pt c/o intermittent bilateral hand numbness and pain since she started new job two weeks ago.

## 2023-07-26 NOTE — ED Provider Notes (Signed)
 [place] EMERGENCY DEPARTMENT AT [facility] Provider Note  CSN: [id number] Arrival date & time: 07/26/23 1007  Chief Complaint(s) Hand Problem  HPI Jill Woods is a 23 y.o. female presenting to the emergency department with bilateral hand pain.  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  Since starting her job, has noticed progressive worsening bilateral hand pain, occasional numbness.  Symptoms are intermittent.  Worse in the mornings.  Have not tried anything for pain.  No other numbness.  No weakness.  No fevers or chills, redness, rash.   Past Medical History Past Medical History:  Diagnosis Date   Allergies    There are no active problems to display for this patient.  Home Medication(s) Prior to Admission medications   Medication Sig Start Date End Date Taking? Authorizing Provider  ibuprofen (ADVIL) 600 MG tablet Take 1 tablet (600 mg total) by mouth every 6 (six) hours as needed. 07/26/23  Yes Lonell Grandchild, MD  ferrous sulfate 325 (65 FE) MG EC tablet Take 1 tablet (325 mg total) by mouth daily. 07/26/18 07/26/19  Gwenevere Abbot, MD  fluticasone (FLONASE) 50 MCG/ACT nasal spray Place 1 spray into both nostrils daily. Patient not taking: Reported on 01/18/2022 07/16/20   Kugler, [place], MD  levocetirizine (XYZAL) 5 MG tablet Take 1 tablet (5 mg total) by mouth daily. Patient not taking: Reported on 01/18/2022 08/22/21   Al Decant, PA-C  loratadine (CLARITIN) 10 MG tablet Take 1 tablet (10 mg total) by mouth daily. Patient not taking: Reported on 01/18/2022 08/30/20   Terrilee Files, MD  ondansetron (ZOFRAN) 4 MG tablet Take 1 tablet (4 mg total) by mouth every 6 (six) hours. 11/13/22   Monna Fam, MD  Prenatal Vit-Fe Fumarate-FA (PRENATAL VITAMINS) 28-0.8 MG TABS Take 1 tablet by mouth daily. Patient not taking: Reported on 01/18/2022 09/29/18   Hermina Staggers, MD                                                                                                                                     Past Surgical History Past Surgical History:  Procedure Laterality Date   CESAREAN SECTION N/A 07/24/2018   Procedure: CESAREAN SECTION;  Surgeon: Lazaro Arms, MD;  Location: [facility] LD ORS;  Service: Obstetrics;  Laterality: N/A;   Family History History reviewed. No pertinent family history.  Social History Social History   Tobacco Use   Smoking status: Never   Smokeless tobacco: Never  Vaping Use   Vaping status: Never Used  Substance Use Topics   Alcohol use: Never   Drug use: Never   Allergies Pollen extract  Review of Systems Review of Systems  All other systems reviewed and are negative.   Physical Exam Vital Signs  I have reviewed the triage vital signs BP 118/63 (BP Location: Right  Arm)   Pulse 81   Temp 98 F (36.7 C) (Oral)   Resp 15   Ht 5' (1.524 m)   Wt 54 kg   SpO2 100%   BMI 23.25 kg/m  Physical Exam Vitals and nursing note reviewed.  Constitutional:      Appearance: Normal appearance.  HENT:     Head: Normocephalic and atraumatic.     Mouth/Throat:     Mouth: Mucous membranes are moist.  Eyes:     Conjunctiva/sclera: Conjunctivae normal.  Cardiovascular:     Rate and Rhythm: Normal rate.  Pulmonary:     Effort: Pulmonary effort is normal. No respiratory distress.  Abdominal:     General: Abdomen is flat.  Musculoskeletal:        General: No deformity.     Comments: No swelling to the hands, normal capillary refill, normal radial pulse bilaterally.  Negative Phalen and Tinel's sign.  No sensory deficit.  Strength 5 out of 5 bilateral grip strength and remainder of the upper extremities  Skin:    General: Skin is warm and dry.     Capillary Refill: Capillary refill takes less than 2 seconds.  Neurological:     General: No focal deficit present.     Mental Status: She is alert. Mental status is at baseline.  Psychiatric:         Mood and Affect: Mood normal.        Behavior: Behavior normal.     ED Results and Treatments Labs (all labs ordered are listed, but only abnormal results are displayed) Labs Reviewed - No data to display                                                                                                                        Radiology No results found.  Pertinent labs & imaging results that were available during my care of the patient were reviewed by me and considered in my medical decision making (see MDM for details).  Medications Ordered in ED Medications  ibuprofen (ADVIL) tablet 600 mg (600 mg Oral Given 07/26/23 1055)                                                                                                                                     Procedures Procedures  (including critical care time)  Medical Decision Making / ED Course   MDM:  23 year old  presenting with bilateral hand pain.  Symptoms began after starting new job which requires significant use of hand with repetitive motion.  Highly suggestive of carpal tunnel syndrome bilaterally although provocative testing is negative.  Discussed NSAID use, wrist brace, ice, recommended follow-up with hand surgery, avoiding triggers as much as possible.  Very low concern for other process.  She reports tingling, no objective deficit.  No strength deficit.  Normal circulation.  No history of trauma to suggest fracture or injury.  Will discharge patient to home. All questions answered. Patient comfortable with plan of discharge. Return precautions discussed with patient and specified on the after visit summary.      Medicines ordered and prescription drug management: Meds ordered this encounter  Medications   ibuprofen (ADVIL) tablet 600 mg   ibuprofen (ADVIL) 600 MG tablet    Sig: Take 1 tablet (600 mg total) by mouth every 6 (six) hours as needed.    Dispense:  30 tablet    Refill:  0    -I have  reviewed the patients home medicines and have made adjustments as needed   Co morbidities that complicate the patient evaluation  Past Medical History:  Diagnosis Date   Allergies       Dispostion: Disposition decision including need for hospitalization was considered, and patient discharged from emergency department.    Final Clinical Impression(s) / ED Diagnoses Final diagnoses:  Bilateral carpal tunnel syndrome     This chart was dictated using voice recognition software.  Despite best efforts to proofread,  errors can occur which can change the documentation meaning.    Lonell Grandchild, MD 07/26/23 1056

## 2023-09-04 ENCOUNTER — Emergency Department (HOSPITAL_COMMUNITY)
Admission: EM | Admit: 2023-09-04 | Discharge: 2023-09-04 | Disposition: A | Discharge status: Home / Self Care | Payer: Self-pay | Attending: Emergency Medicine | Admitting: Emergency Medicine

## 2023-09-04 ENCOUNTER — Other Ambulatory Visit: Payer: Self-pay

## 2023-09-04 DIAGNOSIS — L299 Pruritus, unspecified: Secondary | ICD-10-CM | POA: Insufficient documentation

## 2023-09-04 MED ORDER — LORATADINE 10 MG PO TABS: 10.0000 mg | ORAL_TABLET | Freq: Every day | ORAL | 2 refills | Status: AC

## 2023-09-04 MED ORDER — LEVOCETIRIZINE DIHYDROCHLORIDE 5 MG PO TABS: 5.0000 mg | ORAL_TABLET | Freq: Every day | ORAL | 0 refills | Status: DC

## 2023-09-04 MED ORDER — LORATADINE 10 MG PO TABS: 10.0000 mg | ORAL_TABLET | Freq: Every day | ORAL | 2 refills | Status: DC

## 2023-09-04 NOTE — ED Provider Notes (Signed)
 [facility] EMERGENCY DEPARTMENT AT [facility] Provider Note   CSN: [id number] Arrival date & time: 09/04/23  1022     History  Chief Complaint  Patient presents with   Pruritis   Allergies   Facial Swelling    Jill Woods is a 23 y.o. female.  The history is provided by the patient and medical records. No language interpreter was used.     22 year old female history of pollen allergy presenting with complaint of allergic reaction.  Patient report for the past 3 days she has been noticing itchiness throughout her body that felt similar to prior allergies.  She also having to clear her throat on occasion.  She does not complain of any itchy eyes or sneezing.  She does not endorse any tongue swelling, wheezing, shortness of breath or abdominal cramping or lightheadedness.  She is unsure what triggers her symptoms and denies any change in her environment.  She mention in the past when she had the symptoms she was prescribed medication ER that seems to work but she no longer have access to it.  She does not like to take Benadryl  or Zyrtec as it makes her drowsy.  Home Medications Prior to Admission medications   Medication Sig Start Date End Date Taking? Authorizing Provider  ferrous sulfate  325 (65 FE) MG EC tablet Take 1 tablet (325 mg total) by mouth daily. 07/26/18 07/26/19  Tamra Falling, MD  fluticasone  (FLONASE ) 50 MCG/ACT nasal spray Place 1 spray into both nostrils daily. Patient not taking: Reported on 01/18/2022 07/16/20   Kugler, [place], MD  ibuprofen  (ADVIL ) 600 MG tablet Take 1 tablet (600 mg total) by mouth every 6 (six) hours as needed. 07/26/23   Mordecai Applebaum, MD  levocetirizine (XYZAL ) 5 MG tablet Take 1 tablet (5 mg total) by mouth daily. Patient not taking: Reported on 01/18/2022 08/22/21   Adel Aden, PA-C  loratadine  (CLARITIN ) 10 MG tablet Take 1 tablet (10 mg total) by mouth daily. Patient not taking: Reported on 01/18/2022 08/30/20    Tonya Fredrickson, MD  ondansetron  (ZOFRAN ) 4 MG tablet Take 1 tablet (4 mg total) by mouth every 6 (six) hours. 11/13/22   Jayson Michael, MD  Prenatal Vit-Fe Fumarate-FA (PRENATAL VITAMINS) 28-0.8 MG TABS Take 1 tablet by mouth daily. Patient not taking: Reported on 01/18/2022 09/29/18   Ervin, Michael L, MD      Allergies    Pollen extract    Review of Systems   Review of Systems  All other systems reviewed and are negative.   Physical Exam Updated Vital Signs BP 106/60   Temp 98.3 F (36.8 C) (Oral)   Resp 18   Ht 5' (1.524 m)   Wt 59 kg   LMP 08/14/2023   SpO2 98%   BMI 25.39 kg/m  Physical Exam Vitals and nursing note reviewed.  Constitutional:      General: She is not in acute distress.    Appearance: She is well-developed.  HENT:     Head: Atraumatic.  Eyes:     Conjunctiva/sclera: Conjunctivae normal.  Cardiovascular:     Rate and Rhythm: Normal rate and regular rhythm.     Pulses: Normal pulses.     Heart sounds: Normal heart sounds.  Pulmonary:     Effort: Pulmonary effort is normal.  Abdominal:     Palpations: Abdomen is soft.     Tenderness: There is no abdominal tenderness.  Musculoskeletal:        General: Normal  range of motion.     Cervical back: Neck supple.  Skin:    Findings: No rash.  Neurological:     Mental Status: She is alert.  Psychiatric:        Mood and Affect: Mood normal.     ED Results / Procedures / Treatments   Labs (all labs ordered are listed, but only abnormal results are displayed) Labs Reviewed - No data to display  EKG None  Radiology No results found.  Procedures Procedures    Medications Ordered in ED Medications - No data to display  ED Course/ Medical Decision Making/ A&P                                 Medical Decision Making Risk OTC drugs.   BP 106/60   Temp 98.3 F (36.8 C) (Oral)   Resp 18   Ht 5' (1.524 m)   Wt 59 kg   LMP 08/14/2023   SpO2 98%   BMI 25.39 kg/m   38:76 AM   23 year old female history of pollen allergy presenting with complaint of allergic reaction.  Patient report for the past 3 days she has been noticing itchiness throughout her body that felt similar to prior allergies.  She also having to clear her throat on occasion.  She does not complain of any itchy eyes or sneezing.  She does not endorse any tongue swelling, wheezing, shortness of breath or abdominal cramping or lightheadedness.  She is unsure what triggers her symptoms and denies any change in her environment.  She mention in the past when she had the symptoms she was prescribed medication ER that seems to work but she no longer have access to it.  She does not like to take Benadryl  or Zyrtec as it makes her drowsy.  On exam patient is well-appearing appears to be in no acute discomfort.  Ear nose and throat exam unremarkable.  Skin exam without any concerning urticarial rash or concerning rash.  Her heart with normal rate and rhythm, her lungs are clear abdomen is soft nontender.  Vital sign overall reassuring.  For symptom, I will prescribe patient loratadine  as she has had that in the past.        Final Clinical Impression(s) / ED Diagnoses Final diagnoses:  Itching    Rx / DC Orders ED Discharge Orders          Ordered    levocetirizine (XYZAL ) 5 MG tablet  Daily        09/04/23 1200    loratadine  (CLARITIN ) 10 MG tablet  Daily,   Status:  Discontinued        09/04/23 1200    loratadine  (CLARITIN ) 10 MG tablet  Daily        09/04/23 1201              Debbra Fairy, PA-C 09/06/23 1610    Deatra Face, MD 09/10/23 2356

## 2023-09-04 NOTE — ED Triage Notes (Signed)
 Pt. Stated, I started having itching all over and my eyes swollen especially the left eye. I do have allergies. This started Monday.

## 2023-09-04 NOTE — Discharge Instructions (Signed)
 I have represcribed your allergy medication including Claritin  and Xyzal .  You may take either one of these medications as needed for your itchiness but not both together.  Return if you have any concern.

## 2024-03-23 ENCOUNTER — Ambulatory Visit: Admitting: Obstetrics & Gynecology

## 2024-05-07 ENCOUNTER — Encounter: Payer: Self-pay | Admitting: Emergency Medicine

## 2024-05-07 ENCOUNTER — Ambulatory Visit
Admission: EM | Admit: 2024-05-07 | Discharge: 2024-05-07 | Disposition: A | Discharge status: Home / Self Care | Attending: Nurse Practitioner | Admitting: Nurse Practitioner

## 2024-05-07 DIAGNOSIS — Z3202 Encounter for pregnancy test, result negative: Secondary | ICD-10-CM | POA: Diagnosis present

## 2024-05-07 DIAGNOSIS — N898 Other specified noninflammatory disorders of vagina: Secondary | ICD-10-CM | POA: Insufficient documentation

## 2024-05-07 LAB — POCT URINE PREGNANCY: Preg Test, Ur: NEGATIVE

## 2024-05-07 NOTE — Discharge Instructions (Addendum)
 The pregnancy test is negative today  We will contact you with any positive results from the testing today.  You will also see the results in Mychart.

## 2024-05-07 NOTE — ED Provider Notes (Signed)
 " [facility]    CSN: [id number] Arrival date & time: 05/07/24  0930      History   Chief Complaint No chief complaint on file.   HPI Jill Woods is a 24 y.o. female.   Patient presents today for 1 week history of white/yellow vaginal discharge.  She denies any foul vaginal smell,  vaginal itching, burning with urination, increased urinary frequency or urgency.  She endorses some cramping in the lower abdomen but denies any change in bowel movements, nausea, vomiting, fevers, sweats, or chills.  Reports she felt kicking in her lower abdomen last night.  LMP is unknown as the patient has Nexplanon  implant.  No known exposures to STI today.  She declines HIV/RPR testing today.  Patient plans to have nexplanon  removed at OB/GYN appointment 05/14/24 and is going to TTC.     Past Medical History:  Diagnosis Date   Allergies     There are no active problems to display for this patient.   Past Surgical History:  Procedure Laterality Date   CESAREAN SECTION N/A 07/24/2018   Procedure: CESAREAN SECTION;  Surgeon: Jayne Vonn DEL, MD;  Location: [facility] LD [facility]S;  Service: Obstetrics;  Laterality: N/A;    OB History     Gravida  1   Para      Term      Preterm      AB      Living         SAB      IAB      Ectopic      Multiple      Live Births               Home Medications    Prior to Admission medications  Medication Sig Start Date End Date Taking? Authorizing Provider  etonogestrel  (NEXPLANON ) 68 MG IMPL implant 1 each by Subdermal route once.   Yes [provider]  ibuprofen  (ADVIL ) 600 MG tablet Take 1 tablet (600 mg total) by mouth every 6 (six) hours as needed. 07/26/23   Francesca Elsie CROME, MD  loratadine  (CLARITIN ) 10 MG tablet Take 1 tablet (10 mg total) by mouth daily. 09/04/23   Nivia Colon, PA-C    Family History History reviewed. No pertinent family history.  Social History Social History[1]   Allergies    Pollen extract   Review of Systems Review of Systems Per HPI  Physical Exam Triage Vital Signs ED Triage Vitals  Encounter Vitals Group     BP 05/07/24 0950 102/63     Girls Systolic BP Percentile --      Girls Diastolic BP Percentile --      Boys Systolic BP Percentile --      Boys Diastolic BP Percentile --      Pulse Rate 05/07/24 0950 90     Resp 05/07/24 0950 16     Temp 05/07/24 0950 98.9 F (37.2 C)     Temp Source 05/07/24 0950 Oral     SpO2 05/07/24 0950 98 %     Weight --      Height --      Head Circumference --      Peak Flow --      Pain Score 05/07/24 0951 0     Pain Loc --      Pain Education --      Exclude from Growth Chart --    No data found.  Updated Vital Signs BP 102/63 (BP Location:  Right Arm)   Pulse 90   Temp 98.9 F (37.2 C) (Oral)   Resp 16   LMP  (LMP Unknown)   SpO2 98%   Visual Acuity Right Eye Distance:   Left Eye Distance:   Bilateral Distance:    Right Eye Near:   Left Eye Near:    Bilateral Near:     Physical Exam Vitals and nursing note reviewed.  Constitutional:      General: She is not in acute distress.    Appearance: Normal appearance. She is not toxic-appearing.  Pulmonary:     Effort: Pulmonary effort is normal. No respiratory distress.  Genitourinary:    Comments: Deferred - self swab performed by patient Skin:    General: Skin is warm and dry.     Coloration: Skin is not jaundiced or pale.     Findings: No erythema.  Neurological:     Mental Status: She is alert and oriented to person, place, and time.     Motor: No weakness.     Gait: Gait normal.  Psychiatric:        Mood and Affect: Mood normal.        Behavior: Behavior is cooperative.      UC Treatments / Results  Labs (all labs ordered are listed, but only abnormal results are displayed) Labs Reviewed  POCT URINE PREGNANCY - Normal  CERVICOVAGINAL ANCILLARY ONLY    EKG   Radiology No results found.  Procedures Procedures  (including critical care time)  Medications Ordered in UC Medications - No data to display  Initial Impression / Assessment and Plan / UC Course  I have reviewed the triage vital signs and the nursing notes.  Pertinent labs & imaging results that were available during my care of the patient were reviewed by me and considered in my medical decision making (see chart for details).  Patient is a pleasant well appearing 24 year old presenting today for vaginal discharge.  Vital signs are stable.  Self swab was performed by the patient to evaluate for gonorrhea, chlamydia, trichomonas, yeast vaginitis, and bacterial vaginosis today.  Treatment deferred until results return.  Patient declines HIV and RPR testing today.  Safe sex practices discussed.  The patient was given the opportunity to ask questions.  All questions answered to their satisfaction.  The patient is in agreement to this plan.   Final Clinical Impressions(s) / UC Diagnoses   Final diagnoses:  Vaginal discharge  Urine pregnancy test negative     Discharge Instructions      The pregnancy test is negative today  We will contact you with any positive results from the testing today.  You will also see the results in Mychart.     ED Prescriptions   None    PDMP not reviewed this encounter.     [1]  Social History Tobacco Use   Smoking status: Never   Smokeless tobacco: Never  Vaping Use   Vaping status: Never Used  Substance Use Topics   Alcohol use: Never   Drug use: Never     Chandra Harlene LABOR, NP 05/07/24 1242  "

## 2024-05-07 NOTE — ED Triage Notes (Signed)
 States has been having lower ABD cramps and discharge x 1 week.  States she feels a kicking sensation in lower abd.  States vaginal discharge is white and yellow.

## 2024-05-08 ENCOUNTER — Ambulatory Visit (HOSPITAL_COMMUNITY): Payer: Self-pay

## 2024-05-08 LAB — CERVICOVAGINAL ANCILLARY ONLY
Bacterial Vaginitis (gardnerella): POSITIVE — AB
Candida Glabrata: NEGATIVE
Candida Vaginitis: POSITIVE — AB
Chlamydia: POSITIVE — AB
Comment: NEGATIVE
Comment: NEGATIVE
Comment: NEGATIVE
Comment: NEGATIVE
Comment: NEGATIVE
Comment: NORMAL
Neisseria Gonorrhea: NEGATIVE
Trichomonas: NEGATIVE

## 2024-05-08 MED ORDER — DOXYCYCLINE HYCLATE 100 MG PO TABS: 100.0000 mg | ORAL_TABLET | Freq: Two times a day (BID) | ORAL | 0 refills | Status: AC

## 2024-05-08 MED ORDER — METRONIDAZOLE 500 MG PO TABS: 500.0000 mg | ORAL_TABLET | Freq: Two times a day (BID) | ORAL | 0 refills | Status: AC

## 2024-05-08 MED ORDER — FLUCONAZOLE 150 MG PO TABS: 150.0000 mg | ORAL_TABLET | Freq: Once | ORAL | 0 refills | Status: AC

## 2024-05-14 ENCOUNTER — Ambulatory Visit: Admitting: Obstetrics and Gynecology

## 2024-05-14 ENCOUNTER — Encounter: Payer: Self-pay | Admitting: Obstetrics and Gynecology

## 2024-05-14 VITALS — BP 102/66 | HR 86 | Wt 128.5 lb

## 2024-05-14 DIAGNOSIS — Z3169 Encounter for other general counseling and advice on procreation: Secondary | ICD-10-CM | POA: Diagnosis not present

## 2024-05-14 DIAGNOSIS — Z3046 Encounter for surveillance of implantable subdermal contraceptive: Secondary | ICD-10-CM

## 2024-05-14 DIAGNOSIS — A749 Chlamydial infection, unspecified: Secondary | ICD-10-CM | POA: Diagnosis not present

## 2024-05-14 HISTORY — PX: REMOVAL OF IMPLANON ROD: OBO 1006

## 2024-05-14 MED ORDER — THERA VITAL M PO TABS: 1.0000 | ORAL_TABLET | Freq: Every day | ORAL | Status: AC

## 2024-05-14 NOTE — Progress Notes (Signed)
 Obstetrics and Gynecology Visit Return Patient Evaluation  Appointment Date: 05/14/2024  Primary Care Provider: Patient, No Pcp Per  OBGYN Clinic: [facility] for [facility]  Chief Complaint: desires to conceive. Nexplanon  removal  History of Present Illness:  Jill Woods is a 24 y.o. P1 with above CC Patient confirms taking PO meds for recent CT diagnosis.   Review of Systems:  as noted in the History of Present Illness.  Medications:  Jill Woods had no medications administered during this visit. Current Outpatient Medications  Medication Sig Dispense Refill   doxycycline  (VIBRA -TABS) 100 MG tablet Take 1 tablet (100 mg total) by mouth 2 (two) times daily for 7 days. 14 tablet 0   etonogestrel  (NEXPLANON ) 68 MG IMPL implant 1 each by Subdermal route once.     fluconazole  (DIFLUCAN ) 150 MG tablet Take by mouth.     metroNIDAZOLE  (FLAGYL ) 500 MG tablet Take 1 tablet (500 mg total) by mouth 2 (two) times daily for 7 days. 14 tablet 0   ibuprofen  (ADVIL ) 600 MG tablet Take 1 tablet (600 mg total) by mouth every 6 (six) hours as needed. (Patient not taking: Reported on 05/14/2024) 30 tablet 0   loratadine  (CLARITIN ) 10 MG tablet Take 1 tablet (10 mg total) by mouth daily. (Patient not taking: Reported on 05/14/2024) 60 tablet 2   No current facility-administered medications for this visit.    Allergies: is allergic to pollen extract.  Physical Exam:  BP 102/66   Pulse 86   Wt 128 lb 8 oz (58.3 kg)   LMP  (LMP Unknown)   BMI 25.10 kg/m  Body mass index is 25.1 kg/m. General appearance: Well nourished, well developed female in no acute distress.  Neuro/Psych:  Normal mood and affect.    See procedure uncomplicated nexplanon  removal  Assessment: patient doing well   Plan:  1. Chlamydia (Primary) Recommend she repeat testing in 3 months. Patient amenable to serum testing today  2. Nexplanon  removal Recommended she start a MVI    3. GYN D/w her due for pap late summer/early fall   Bebe Izell Raddle MD Attending Center for [employer] [facility])

## 2024-05-14 NOTE — Procedures (Signed)
 Nexplanon  Removal Procedure Note Patient desires to conceive  After informed consent was obtained, the patient's left arm was examined and the Nexplanon  rod was noted to be easily palpable. The area was cleaned with alcohol then local anesthesia was infiltrated with 3 ml of 1% lidocaine  with epinephrine . The area was prepped with betadine. Using sterile technique, an 11 blade was used to make an incision, and the Nexplanon  device was brought to the incision site. The capsule was scrapped off with the scalpel, the Nexplanon  grasped with hemostats, and easily removed; the removal site was hemostatic. The Nexplanon  was inspected and noted to be intact.  A steri-strip and a pressure dressing was applied.  The patient tolerated the procedure well.  Recommended to her that she start a daily multivitamin  Jill Woods, Jr MD Attending Center for [employer] [facility])

## 2024-05-16 LAB — RPR+HBSAG+HCVAB+...
HIV Screen 4th Generation wRfx: NONREACTIVE
Hep C Virus Ab: NONREACTIVE
Hepatitis B Surface Ag: NEGATIVE
RPR Ser Ql: NONREACTIVE

## 2024-06-02 ENCOUNTER — Other Ambulatory Visit: Payer: Self-pay

## 2024-06-02 ENCOUNTER — Encounter: Payer: Self-pay | Admitting: Emergency Medicine

## 2024-06-02 ENCOUNTER — Ambulatory Visit
Admission: EM | Admit: 2024-06-02 | Discharge: 2024-06-02 | Disposition: A | Discharge status: Home / Self Care | Source: Home / Self Care

## 2024-06-02 DIAGNOSIS — K30 Functional dyspepsia: Secondary | ICD-10-CM | POA: Diagnosis not present

## 2024-06-02 DIAGNOSIS — R109 Unspecified abdominal pain: Secondary | ICD-10-CM | POA: Diagnosis not present

## 2024-06-02 LAB — POCT URINE DIPSTICK
Bilirubin, UA: NEGATIVE
Blood, UA: NEGATIVE
Glucose, UA: NEGATIVE mg/dL
Ketones, POC UA: NEGATIVE mg/dL
Nitrite, UA: NEGATIVE
POC PROTEIN,UA: NEGATIVE
Spec Grav, UA: 1.02
Urobilinogen, UA: 0.2 U/dL
pH, UA: 8

## 2024-06-02 LAB — POCT URINE PREGNANCY: Preg Test, Ur: NEGATIVE

## 2024-06-02 MED ORDER — FAMOTIDINE 20 MG PO TABS: 20.0000 mg | ORAL_TABLET | Freq: Two times a day (BID) | ORAL | 0 refills | Status: AC | PRN

## 2024-06-02 NOTE — Discharge Instructions (Signed)
 We have sent out a urine culture to obtain more information, and we will let you know if this changes your treatment in any way.  I suspect your symptoms be related to acid reflux, try the Pepcid  twice daily as needed, Tums as needed, eat bland foods and drink plenty of water.  Follow-up with your primary care provider if not resolving

## 2024-06-02 NOTE — ED Triage Notes (Addendum)
 Pt reports intermittent abdominal cramping, dyspnea with exertion, and get full quickly when eating since Thursday. Denies fever, n/v/d. LBM this am. LMP 1/16.NAD noted in triage.

## 2024-06-02 NOTE — ED Provider Notes (Signed)
 " [facility]    CSN: [id number] Arrival date & time: 06/02/24  1010      History   Chief Complaint Chief Complaint  Patient presents with   Abdominal Cramping    HPI Jill Woods is a 24 y.o. female.   Patient presenting today with 4 to 5-day history of intermittent abdominal cramping typically in the upper abdomen, early satiety, bloating, belching.  Denies fever, nausea, vomiting, diarrhea, constipation, vaginal symptoms, urinary symptoms.  So far not try anything over-the-counter for symptoms.  LMP 05/15/2024.  States she had her Nexplanon  removed several weeks ago and wanting a pregnancy test.  Per chart review, she was treated for multiple vaginal infections about a month ago, states she took all of her medications for these and symptoms resolved.    Past Medical History:  Diagnosis Date   Allergies     Patient Active Problem List   Diagnosis Date Noted   Chlamydia 05/14/2024    Past Surgical History:  Procedure Laterality Date   CESAREAN SECTION N/A 07/24/2018   Procedure: CESAREAN SECTION;  Surgeon: Jayne Vonn DEL, MD;  Location: [facility] LD [facility]S;  Service: Obstetrics;  Laterality: N/A;   REMOVAL OF IMPLANON  ROD  05/14/2024    OB History     Gravida  1   Para      Term      Preterm      AB      Living         SAB      IAB      Ectopic      Multiple      Live Births               Home Medications    Prior to Admission medications  Medication Sig Start Date End Date Taking? Authorizing Provider  famotidine  (PEPCID ) 20 MG tablet Take 1 tablet (20 mg total) by mouth 2 (two) times daily as needed for heartburn or indigestion. 06/02/24  Yes Stuart Vernell Norris, PA-C  etonogestrel  (NEXPLANON ) 68 MG IMPL implant 1 each by Subdermal route once.    [provider]  fluconazole  (DIFLUCAN ) 150 MG tablet Take by mouth. 05/08/24   [provider]  ibuprofen  (ADVIL ) 600 MG tablet Take 1 tablet (600 mg total) by mouth  every 6 (six) hours as needed. Patient not taking: No sig reported 07/26/23   Francesca Elsie CROME, MD  loratadine  (CLARITIN ) 10 MG tablet Take 1 tablet (10 mg total) by mouth daily. Patient not taking: No sig reported 09/04/23   Nivia Colon, PA-C  Multiple Vitamins-Minerals (MULTIVITAMIN) tablet Take 1 tablet by mouth daily. 05/14/24   Izell Harari, MD    Family History History reviewed. No pertinent family history.  Social History Social History[1]   Allergies   Pollen extract   Review of Systems Review of Systems PER HPI  Physical Exam Triage Vital Signs ED Triage Vitals  Encounter Vitals Group     BP 06/02/24 1033 103/69     Girls Systolic BP Percentile --      Girls Diastolic BP Percentile --      Boys Systolic BP Percentile --      Boys Diastolic BP Percentile --      Pulse Rate 06/02/24 1033 74     Resp 06/02/24 1033 18     Temp 06/02/24 1033 98.9 F (37.2 C)     Temp Source 06/02/24 1033 Oral     SpO2 06/02/24 1033 98 %  Weight --      Height --      Head Circumference --      Peak Flow --      Pain Score 06/02/24 1032 0     Pain Loc --      Pain Education --      Exclude from Growth Chart --    No data found.  Updated Vital Signs BP 103/69 (BP Location: Right Arm)   Pulse 74   Temp 98.9 F (37.2 C) (Oral)   Resp 18   LMP 05/15/2024 (Approximate)   SpO2 98%   Visual Acuity Right Eye Distance:   Left Eye Distance:   Bilateral Distance:    Right Eye Near:   Left Eye Near:    Bilateral Near:     Physical Exam Vitals and nursing note reviewed.  Constitutional:      Appearance: Normal appearance. She is not ill-appearing.  HENT:     Head: Atraumatic.     Mouth/Throat:     Mouth: Mucous membranes are moist.  Eyes:     Extraocular Movements: Extraocular movements intact.     Conjunctiva/sclera: Conjunctivae normal.  Cardiovascular:     Rate and Rhythm: Normal rate and regular rhythm.     Heart sounds: Normal heart sounds.  Pulmonary:      Effort: Pulmonary effort is normal.     Breath sounds: Normal breath sounds.  Abdominal:     General: Bowel sounds are normal. There is no distension.     Palpations: Abdomen is soft.     Tenderness: There is no abdominal tenderness. There is no right CVA tenderness, left CVA tenderness or guarding.  Musculoskeletal:        General: Normal range of motion.     Cervical back: Normal range of motion and neck supple.  Skin:    General: Skin is warm and dry.  Neurological:     Mental Status: She is alert and oriented to person, place, and time.  Psychiatric:        Mood and Affect: Mood normal.        Thought Content: Thought content normal.        Judgment: Judgment normal.      UC Treatments / Results  Labs (all labs ordered are listed, but only abnormal results are displayed) Labs Reviewed  POCT URINE DIPSTICK - Abnormal; Notable for the following components:      Result Value   Leukocytes, UA Small (1+) (*)    All other components within normal limits  URINE CULTURE  POCT URINE PREGNANCY    EKG   Radiology No results found.  Procedures Procedures (including critical care time)  Medications Ordered in UC Medications - No data to display  Initial Impression / Assessment and Plan / UC Course  I have reviewed the triage vital signs and the nursing notes.  Pertinent labs & imaging results that were available during my care of the patient were reviewed by me and considered in my medical decision making (see chart for details).     Vital signs and exam very reassuring today with no red flag findings, patient requesting urine pregnancy which was negative, urinalysis today with small leuks but very low suspicion for this to be related to symptoms.  Urine culture pending for further evaluation.  Suspect acid reflux/indigestion to be causing her symptoms.  Trial Pepcid , bland foods, fluids, and follow-up for worsening or unresolving symptoms.  Return for worsening or  unresolving symptoms.  Final Clinical Impressions(s) / UC Diagnoses   Final diagnoses:  Abdominal cramping  Indigestion     Discharge Instructions      We have sent out a urine culture to obtain more information, and we will let you know if this changes your treatment in any way.  I suspect your symptoms be related to acid reflux, try the Pepcid  twice daily as needed, Tums as needed, eat bland foods and drink plenty of water.  Follow-up with your primary care provider if not resolving    ED Prescriptions     Medication Sig Dispense Auth. Provider   famotidine  (PEPCID ) 20 MG tablet Take 1 tablet (20 mg total) by mouth 2 (two) times daily as needed for heartburn or indigestion. 30 tablet Stuart Vernell Norris, [place]      PDMP not reviewed this encounter.    [1]  Social History Tobacco Use   Smoking status: Never   Smokeless tobacco: Never  Vaping Use   Vaping status: Never Used  Substance Use Topics   Alcohol use: Never   Drug use: Never     Stuart Vernell Norris, PA-C 06/02/24 1133  "

## 2024-06-04 ENCOUNTER — Ambulatory Visit (HOSPITAL_COMMUNITY): Payer: Self-pay

## 2024-06-04 LAB — URINE CULTURE: Culture: NO GROWTH

## 2024-08-12 ENCOUNTER — Ambulatory Visit: Payer: Self-pay
